# Patient Record
Sex: Female | Born: 1975 | Race: White | Hispanic: No | Marital: Single | Smoking: Current every day smoker
Health system: Southern US, Community
[De-identification: ages and names within clinical notes are randomized; demographics above are authoritative.]

## PROBLEM LIST (undated history)

## (undated) DIAGNOSIS — A4902 Methicillin resistant Staphylococcus aureus infection, unspecified site: Secondary | ICD-10-CM

## (undated) DIAGNOSIS — N83209 Unspecified ovarian cyst, unspecified side: Secondary | ICD-10-CM

## (undated) DIAGNOSIS — E079 Disorder of thyroid, unspecified: Secondary | ICD-10-CM

## (undated) HISTORY — PX: ABDOMINAL HYSTERECTOMY: SHX81

---

## 1998-07-31 ENCOUNTER — Emergency Department (HOSPITAL_COMMUNITY): Admission: EM | Admit: 1998-07-31 | Discharge: 1998-07-31 | Payer: Self-pay | Admitting: Emergency Medicine

## 2001-12-28 ENCOUNTER — Emergency Department (HOSPITAL_COMMUNITY): Admission: EM | Admit: 2001-12-28 | Discharge: 2001-12-28 | Payer: Self-pay | Admitting: *Deleted

## 2002-06-22 ENCOUNTER — Emergency Department (HOSPITAL_COMMUNITY): Admission: EM | Admit: 2002-06-22 | Discharge: 2002-06-22 | Payer: Self-pay | Admitting: *Deleted

## 2003-03-27 ENCOUNTER — Emergency Department (HOSPITAL_COMMUNITY): Admission: EM | Admit: 2003-03-27 | Discharge: 2003-03-27 | Payer: Self-pay | Admitting: Internal Medicine

## 2003-05-05 ENCOUNTER — Emergency Department (HOSPITAL_COMMUNITY): Admission: EM | Admit: 2003-05-05 | Discharge: 2003-05-05 | Payer: Self-pay | Admitting: Emergency Medicine

## 2003-05-25 ENCOUNTER — Emergency Department (HOSPITAL_COMMUNITY): Admission: EM | Admit: 2003-05-25 | Discharge: 2003-05-25 | Payer: Self-pay | Admitting: Emergency Medicine

## 2003-09-27 ENCOUNTER — Emergency Department (HOSPITAL_COMMUNITY): Admission: EM | Admit: 2003-09-27 | Discharge: 2003-09-28 | Payer: Self-pay | Admitting: Emergency Medicine

## 2003-10-22 ENCOUNTER — Inpatient Hospital Stay (HOSPITAL_COMMUNITY): Admission: EM | Admit: 2003-10-22 | Discharge: 2003-10-30 | Payer: Self-pay

## 2003-10-22 ENCOUNTER — Ambulatory Visit: Payer: Self-pay | Admitting: Infectious Diseases

## 2003-10-26 ENCOUNTER — Encounter: Payer: Self-pay | Admitting: Internal Medicine

## 2004-02-22 ENCOUNTER — Inpatient Hospital Stay (HOSPITAL_COMMUNITY): Admission: EM | Admit: 2004-02-22 | Discharge: 2004-02-25 | Payer: Self-pay | Admitting: Emergency Medicine

## 2004-02-22 ENCOUNTER — Encounter: Payer: Self-pay | Admitting: Emergency Medicine

## 2004-11-04 ENCOUNTER — Emergency Department (HOSPITAL_COMMUNITY): Admission: EM | Admit: 2004-11-04 | Discharge: 2004-11-04 | Payer: Self-pay | Admitting: Emergency Medicine

## 2004-11-29 ENCOUNTER — Emergency Department (HOSPITAL_COMMUNITY): Admission: EM | Admit: 2004-11-29 | Discharge: 2004-11-29 | Payer: Self-pay | Admitting: Emergency Medicine

## 2005-07-16 ENCOUNTER — Emergency Department (HOSPITAL_COMMUNITY): Admission: EM | Admit: 2005-07-16 | Discharge: 2005-07-16 | Payer: Self-pay | Admitting: Emergency Medicine

## 2008-02-18 ENCOUNTER — Emergency Department (HOSPITAL_BASED_OUTPATIENT_CLINIC_OR_DEPARTMENT_OTHER): Admission: EM | Admit: 2008-02-18 | Discharge: 2008-02-18 | Payer: Self-pay | Admitting: Emergency Medicine

## 2009-03-04 ENCOUNTER — Emergency Department (HOSPITAL_BASED_OUTPATIENT_CLINIC_OR_DEPARTMENT_OTHER): Admission: EM | Admit: 2009-03-04 | Discharge: 2009-03-04 | Payer: Self-pay | Admitting: Emergency Medicine

## 2009-04-12 ENCOUNTER — Emergency Department (HOSPITAL_BASED_OUTPATIENT_CLINIC_OR_DEPARTMENT_OTHER): Admission: EM | Admit: 2009-04-12 | Discharge: 2009-04-12 | Payer: Self-pay | Admitting: Emergency Medicine

## 2009-04-12 ENCOUNTER — Ambulatory Visit: Payer: Self-pay | Admitting: Diagnostic Radiology

## 2010-04-17 LAB — URINE MICROSCOPIC-ADD ON

## 2010-04-17 LAB — COMPREHENSIVE METABOLIC PANEL
ALT: 11 U/L (ref 0–35)
AST: 14 U/L (ref 0–37)
Albumin: 3.8 g/dL (ref 3.5–5.2)
Alkaline Phosphatase: 63 U/L (ref 39–117)
BUN: 10 mg/dL (ref 6–23)
CO2: 28 mEq/L (ref 19–32)
Calcium: 7.9 mg/dL — ABNORMAL LOW (ref 8.4–10.5)
Chloride: 98 mEq/L (ref 96–112)
Creatinine, Ser: 1 mg/dL (ref 0.4–1.2)
GFR calc Af Amer: >60 mL/min (ref >60)
GFR calc non Af Amer: >60 mL/min (ref >60)
Glucose, Bld: 111 mg/dL — ABNORMAL HIGH (ref 70–99)
Potassium: 2.6 mEq/L — CL (ref 3.5–5.1)
Sodium: 136 mEq/L (ref 135–145)
Total Bilirubin: 0.7 mg/dL (ref 0.3–1.2)
Total Protein: 6.9 g/dL (ref 6.0–8.3)

## 2010-04-17 LAB — URINE CULTURE: Colony Count: >=100000

## 2010-04-17 LAB — DIFFERENTIAL
Basophils Absolute: 0.1 10*3/uL (ref 0.0–0.1)
Basophils Relative: 1 % (ref 0–1)
Eosinophils Absolute: 0 10*3/uL (ref 0.0–0.7)
Eosinophils Relative: 0 % (ref 0–5)
Lymphocytes Relative: 9 % — ABNORMAL LOW (ref 12–46)
Lymphs Abs: 1.1 10*3/uL (ref 0.7–4.0)
Monocytes Absolute: 1.1 10*3/uL — ABNORMAL HIGH (ref 0.1–1.0)
Monocytes Relative: 8 % (ref 3–12)
Neutro Abs: 10.6 10*3/uL — ABNORMAL HIGH (ref 1.7–7.7)
Neutrophils Relative %: 82 % — ABNORMAL HIGH (ref 43–77)

## 2010-04-17 LAB — CBC
HCT: 38.3 % (ref 36.0–46.0)
Hemoglobin: 13.1 g/dL (ref 12.0–15.0)
MCHC: 34.3 g/dL (ref 30.0–36.0)
MCV: 90 fL (ref 78.0–100.0)
Platelets: 139 10*3/uL — ABNORMAL LOW (ref 150–400)
RBC: 4.25 MIL/uL (ref 3.87–5.11)
RDW: 12.1 % (ref 11.5–15.5)
WBC: 12.9 10*3/uL — ABNORMAL HIGH (ref 4.0–10.5)

## 2010-04-17 LAB — URINALYSIS, ROUTINE W REFLEX MICROSCOPIC
Bilirubin Urine: NEGATIVE
Glucose, UA: NEGATIVE mg/dL
Ketones, ur: NEGATIVE mg/dL
Nitrite: NEGATIVE
Protein, ur: 100 mg/dL — AB
Specific Gravity, Urine: 1.016 (ref 1.005–1.030)
Urobilinogen, UA: 1 mg/dL (ref 0.0–1.0)
pH: 6.5 (ref 5.0–8.0)

## 2010-04-17 LAB — PREGNANCY, URINE: Preg Test, Ur: NEGATIVE

## 2010-04-22 LAB — URINALYSIS, ROUTINE W REFLEX MICROSCOPIC
Bilirubin Urine: NEGATIVE
Glucose, UA: NEGATIVE mg/dL
Hgb urine dipstick: NEGATIVE
Ketones, ur: NEGATIVE mg/dL
Nitrite: NEGATIVE
Protein, ur: NEGATIVE mg/dL
Specific Gravity, Urine: 1.026 (ref 1.005–1.030)
Urobilinogen, UA: 1 mg/dL (ref 0.0–1.0)
pH: 6 (ref 5.0–8.0)

## 2010-06-12 IMAGING — US US PELVIS COMPLETE MODIFY
1 series · 14 of 25 positions shown · non-contrast
Comparison: None.

CLINICAL DATA: Left lower quadrant and left pelvic pain.  Family
history of ovarian cancer.

TRANSABDOMINAL AND TRANSVAGINAL ULTRASOUND OF PELVIS
DOPPLER ULTRASOUND OF OVARIES
TECHNIQUE: Both transabdominal and transvaginal ultrasound
examinations of the pelvis were performed including evaluation of
the uterus, ovaries, adnexal regions, and pelvic cul-de-sac.  Color
and duplex Doppler ultrasound was utilized to evaluate blood flow
to the ovaries.

[Series 1: us pelvis complete modify · 0.28mm/px · 14 of 42 slices shown]
[im 1/42]
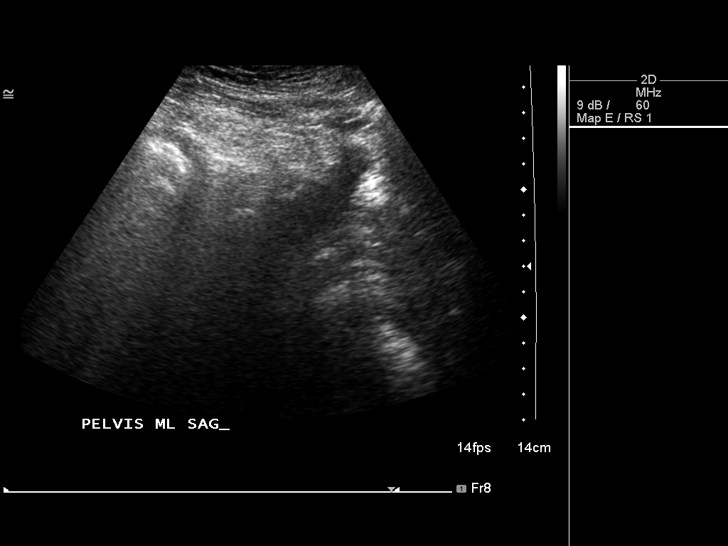
[im 4/42]
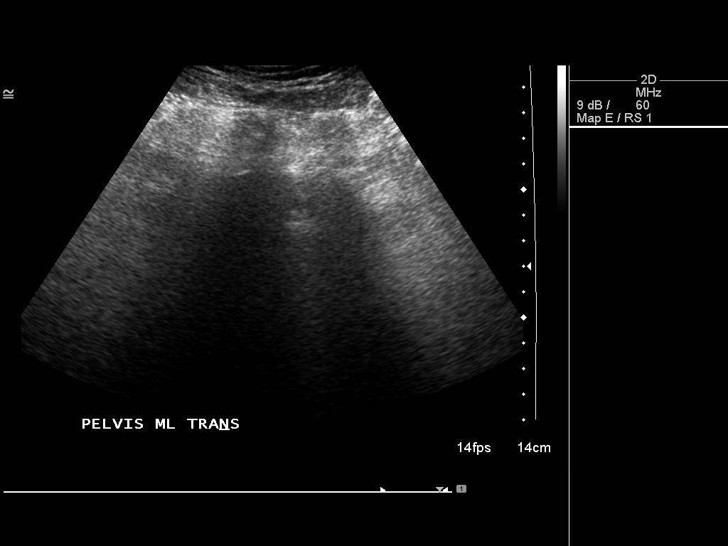
[im 7/42]
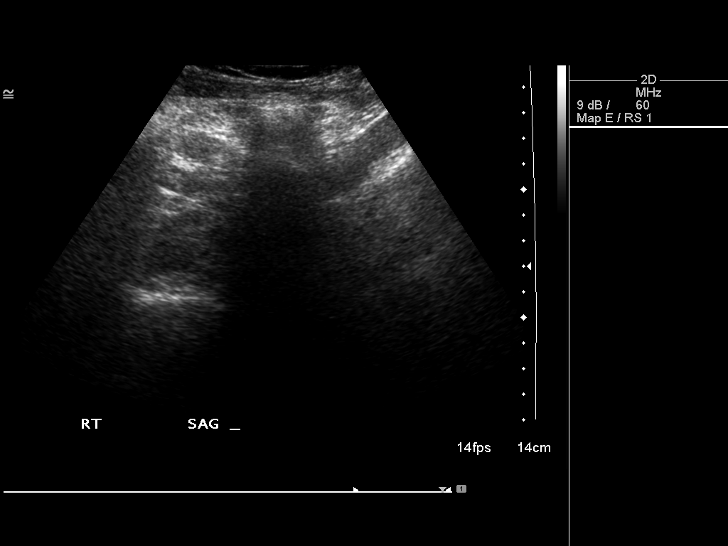
[im 11/42]
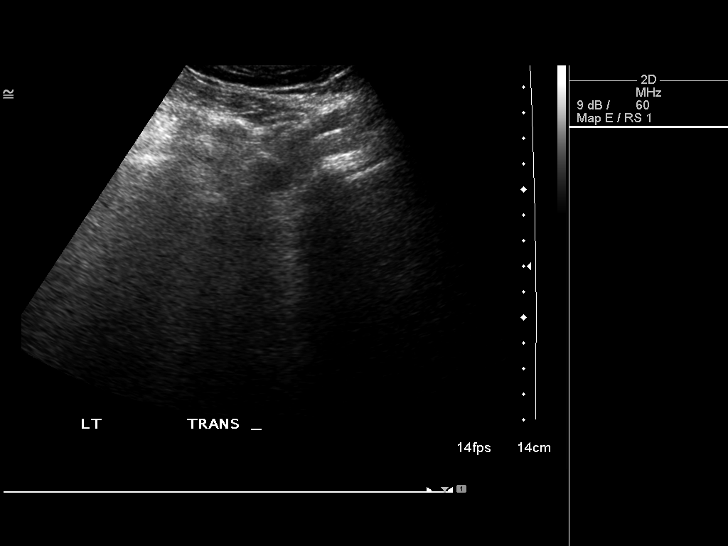
[im 14/42]
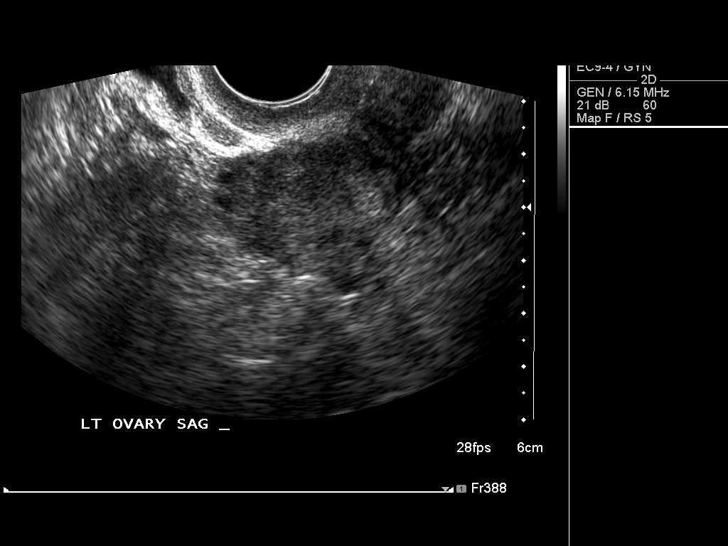
[im 16/42]
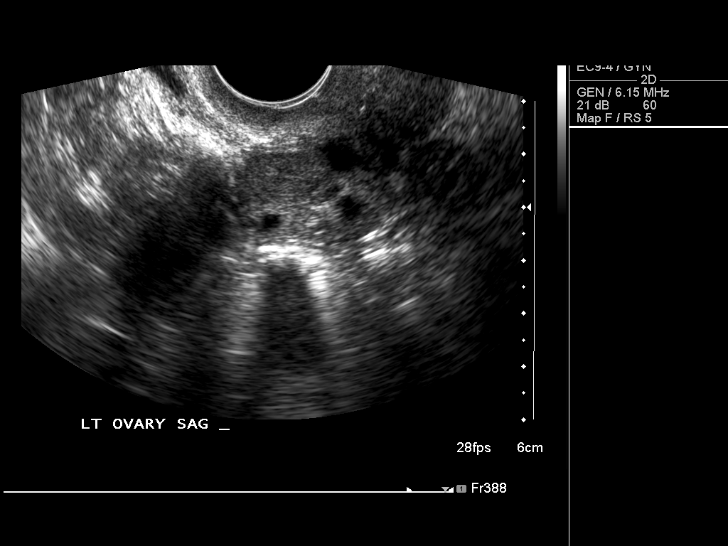
[im 19/42]
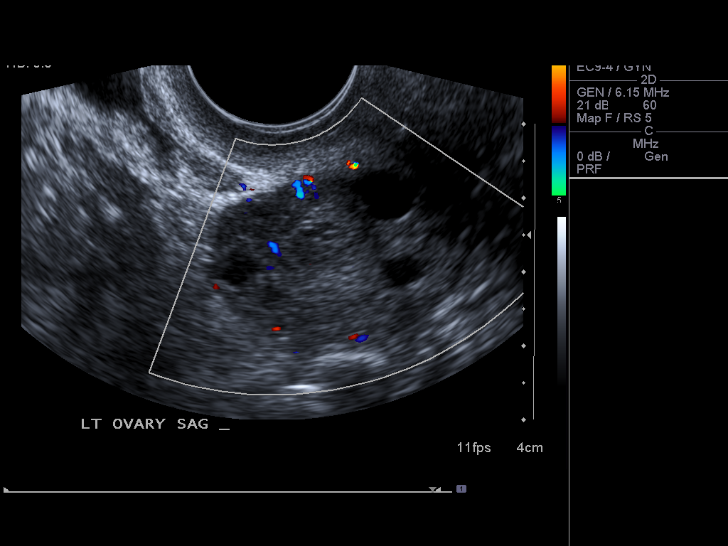
[im 23/42]
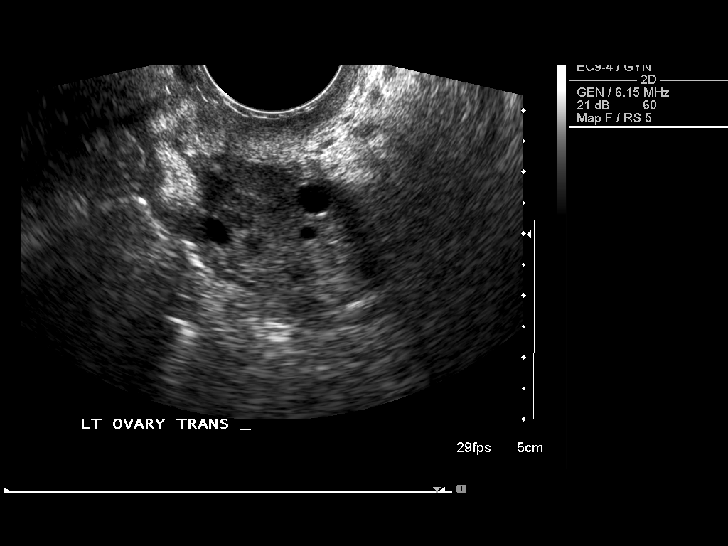
[im 26/42]
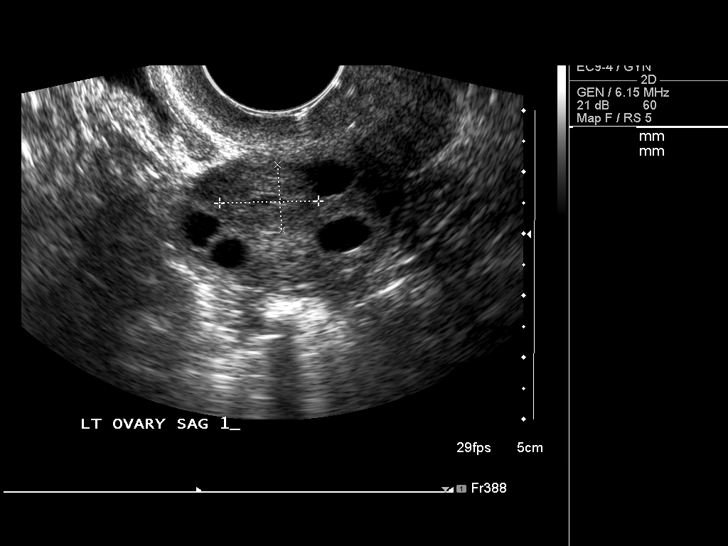
[im 28/42]
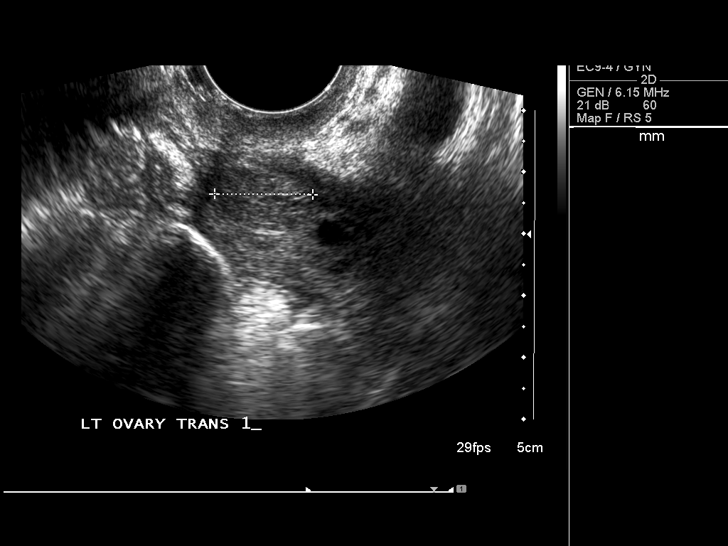
[im 31/42]
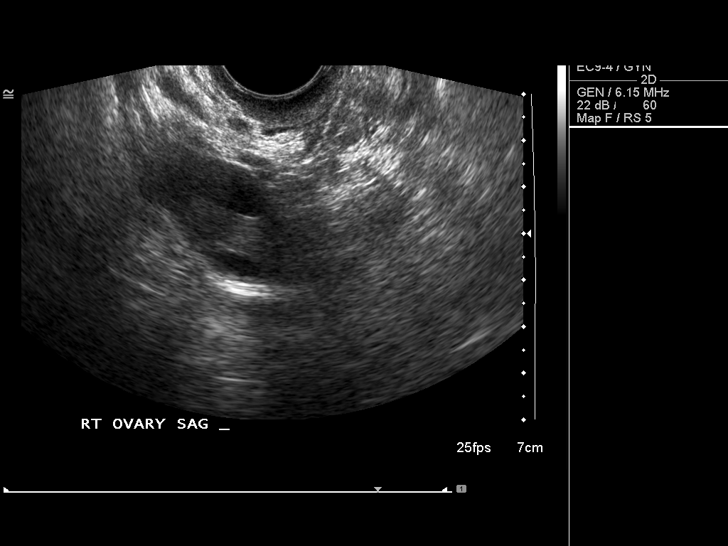
[im 35/42]
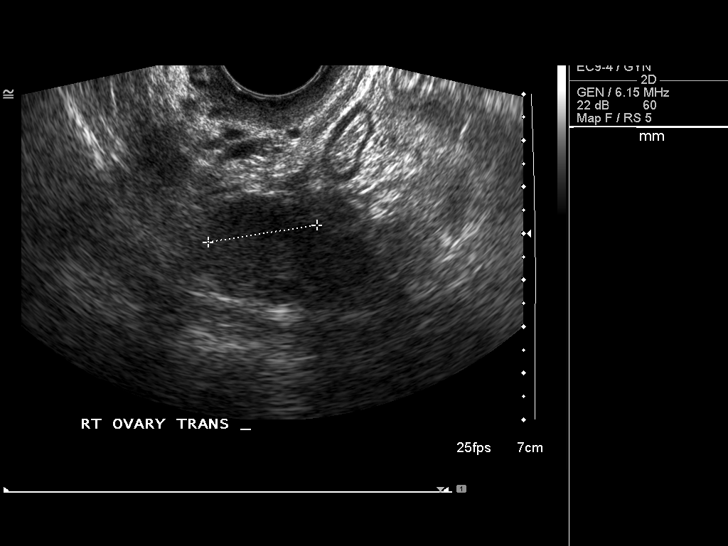
[im 38/42]
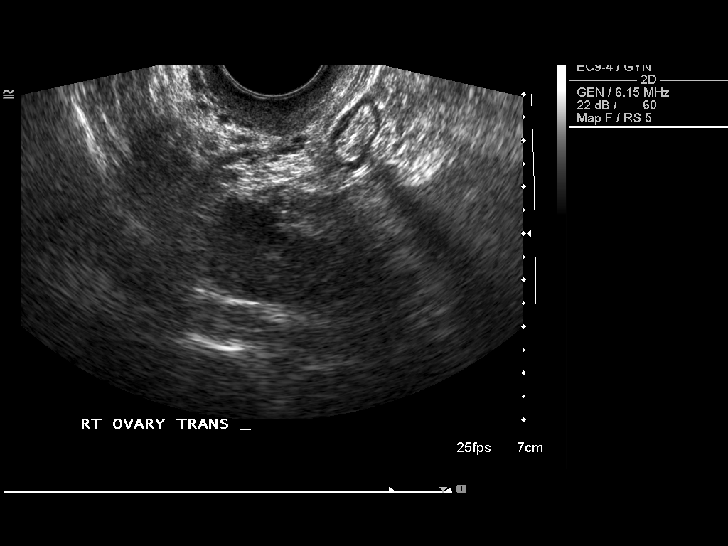
[im 42/42]
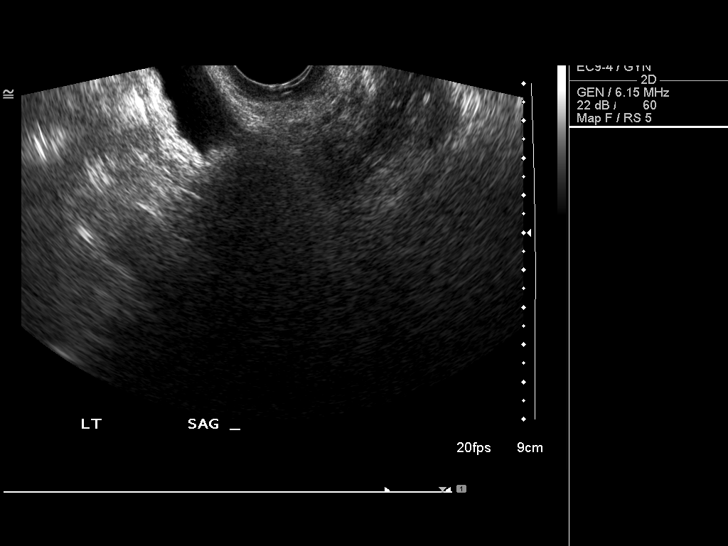

[14 of 25 positions shown; findings below may reference images not displayed]

FINDINGS: Uterus is surgically absent.  Vaginal cuff is unremarkable in
appearance.

Right Ovary measures 3.5 x 2.2 x 2.4 cm.  Normal appearance.

Left Ovary measures 3.9 x 2.3 x 3.2 cm. Normal appearance.

Other Findings:  No adnexal mass or free fluid identified.

Doppler Evaluation:  Color Doppler shows blood flow is present
within both ovaries.  Spectral Doppler evaluation shows normal
arterial and venous waveform patterns in the ovaries.
IMPRESSION: 1.  Previous hysterectomy.  No evidence of pelvic mass or other
acute findings.
2.  No evidence of ovarian torsion.

## 2010-06-14 NOTE — Op Note (Signed)
[name], KASIK                ACCOUNT NO.:  [id number]   MEDICAL RECORD NO.:  [id number]          PATIENT TYPE:  INP   LOCATION:  5038                         FACILITY:  [facility]   PHYSICIAN:  Katy Fitch. Sypher Montez Hageman., M.D.DATE OF BIRTH:  1975/12/08   DATE OF PROCEDURE:  02/22/2004  DATE OF DISCHARGE:                                 OPERATIVE REPORT   DIAGNOSIS:  Complex abscesses involving left thumb and index finger pulps  with history of probable prior viral herpetic whitlow superinfected by  attempts at drainage at home utilizing a needle with probable secondary  Staph infection who MRSA infection.   POSTOPERATIVE DIAGNOSES:  1.  Complex abscesses involving left thumb and index finger pulps with      history of probable prior viral herpetic whitlow superinfected by      attempts at drainage at home utilizing a needle with probable secondary      Staph infection who MRSA infection.  2.  Identification of probable Staphylococcus abscess left index finger pulp      and probable viral abscess of left thumb pulp.   OPERATION:  1.  Incision and drainage of left index finger abscess with placement of an      Iodoform drain and debridement of necrotic dermis and subcutaneous      tissue.  2.  Incision and drainage of left thumb pulp with placement Iodoform packing      and aerobic and aerobic and viral cultures.   OPERATING SURGEON:  Katy Fitch. Sypher, M.D.   ASSISTANT:  No assistant.   ANESTHESIA:  General by LMA.   SUPERVISING ANESTHESIOLOGIST:  Dr. Krista Blue.   INDICATIONS:  Lanie Schelling is a 35 year old woman who admittedly smokes  crack and regular basis.   She has a history of developing swelling of her left index finger pulp and  left thumb pulp during the past several days. She developed tense swelling  and thought she had infection.  Therefore home, she attempted to drain her  fingertips with a sterilized needle. She developed marked discoloration of  her index pulp and  thumb pulp and presented to the emergency room at [facility] this morning with severely swollen discolored  fingers.   She was seen by Dr. Lora Havens attending emergency room physician who made  the diagnosis of probable necrotizing infection of her fingers and requested  hand surgery trauma consult.   Olivine was transferred to [facility] due to a past  history of MRSA endocarditis and is admitted at this time for urgent  incision and drainage of her fingers.   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   She denied smoking crack during  the past several days, although blood  screens at this time were positive for cocaine. She was advised that we  would perform immediate incision and drainage of her fingers anticipating  admission for IV antibiotic therapy initially with vancomycin to cover MRSA.   PROCEDURE:  Caffie Sotto is brought to  operating room and placed supine  position on the operating table.   Following anesthesia consultation by Dr. Krista Blue, general anesthesia by LMA  was induced.   Left arm was prepped with Betadine soap and solution and sterilely draped.   A pneumatic tourniquet was applied to the proximal brachium.   The left arm was elevated for one minute and exsanguinated by a hand  compression followed by inflation arterial tourniquet to 220 mmHg.   Procedure commenced with a midline incision in the index finger releasing  purulent material under pressure. A blunt hemostat was used to spread the  pulp fat and a considerable amount of necrotic fat and fascia was extruded  from the radial aspect of the pulp.   Care was taken to protect the branches of the radial proper digital artery  and nerve.   The necrotic dermis, epidermis and subcutaneous tissues were sharply  debrided followed by placement of a through-and-through iodoform  packing  into the index pulp.   Attention was then directed to the thumb. A radial mid lateral incision was  fashioned in the pulp followed by spreading covering serous fluid that had a  brownish tinge. The thumb drainage was more consistent with a chronic  hepatic whitlow without superinfection.   Aerobic and anaerobic cultures were obtained from the index finger.  Anaerobic and aerobic cultures were from obtained from the thumb as well as  a herpes simplex viral culture.   The thumb wound was then packed with through-and-through Iodoform.   The hand was then placed in a voluminous gauze dressing was with sterile  Kerlix, Webril and a three inch Ace bandage.   There were no apparent complications.   Ms. Pond was awakened from general anesthesia and transferred to the cover  room with stable vital signs.   She will be admitted for IV antibiotic therapy initially in the form of  vancomycin 1 gram IV in the operating room and post anesthesia care area  followed by admission for the vancomycin protocol for a  minimum 48 hours  pending culture results.      RVS/MEDQ  D:  02/22/2004  T:  02/22/2004  Job:  [id number]
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
## 2010-06-14 NOTE — Discharge Summary (Signed)
[name], [name]                ACCOUNT NO.:  [id number]   MEDICAL RECORD NO.:  [id number]          PATIENT TYPE:  INP   LOCATION:  5038                         FACILITY:  [facility]   PHYSICIAN:  Marveen Reeks. Dasnoit, P.A.DATE OF BIRTH:  1975-03-06   DATE OF ADMISSION:  02/22/2004  DATE OF DISCHARGE:  02/25/2004                                 DISCHARGE SUMMARY   ADMISSION DIAGNOSIS:  Abscess left thumb and left index fingers.   DISCHARGE DIAGNOSIS:  Abscess left thumb and left index finger.   OPERATION PERFORMED:  Incision and drainage of left hand abscesses with  interoperative cultures by Dr. Katy Fitch. Sypher, February 22, 2004.   HISTORY:  The patient is a 35 year old right hand dominant female who  presented to the emergency room  complaining of a one week history of  infected left thumb and index finger.  These were self-drained but recurred.  There was no medical treatment until today when she presented to the  hospital.  She was transferred from [facility] to [facility] emergency room  for hand consultation.  She was seen by Dr. Katy Fitch. Sypher and it was felt  that she had complex right hand abscesses that needed incision and drainage  and admission postoperatively for IV antibiotics.  The patient did have a  recent history of MRSA endocarditis and was admitted to [facility]  in October 2005 for treatment.  This apparently occurred from a left elbow  wound.  The patient also did have a history of crack cocaine abuse.  Preoperative EKG revealed sinus rhythm with short PR, nonspecific T-wave  abnormality.  White blood cell count was 7.9.   HOSPITAL COURSE:  The patient was taken to the operating room on May 22, 2004, where she underwent I&D of left hand abscesses of the first and second  rays.  Interoperative cultures were taken in addition to Herpes viral  cultures.  Postoperatively, she was started on vancomycin 1 gram IV per the  pharmacy protocol.  She was  given Dilaudid p.o./IV for pain control.  She  was placed on wound isolation.  On January 27, her first day postop, she had  a max temperature of 98.6, vital signs were stable, culture and sensitivity  revealed gram positive cocci.  Her packing was removed in whirlpool.  During  the exam, the patient kept nodding off.  Overall, her wounds were  stabilizing.  We continued her vancomycin per protocol.  Her final cultures  were pending.  On exam January 28, the patient was seen in the whirlpool,  the wounds were doing well.  On January 29, the patient remained afebrile  with stable vital signs, there were minimal complaints of pain.  Dressings  were intact.  It was felt  that the patient was ready for discharge to home.  She was given prescriptions for Doxycycline 100 mg p.o. b.i.d., Septra DS 1  p.o. b.i.d., Dilaudid for pain control.  She will return to see [place] in one  day on February 26, 2004, for recheck.  Her postop diet is as usual.  Activities:  She will keep her hand elevated and dressing dry.  Her  condition on discharge is stable and improved.   FINAL DIAGNOSIS:  Complex left hand abscesses of the left first and second  rays that eventually grew out MRSA on her cultures.     RJD/MEDQ  D:  05/23/2004  T:  05/23/2004  Job:  [id number]

## 2010-06-26 ENCOUNTER — Emergency Department (HOSPITAL_COMMUNITY)
Admission: EM | Admit: 2010-06-26 | Discharge: 2010-06-26 | Disposition: A | Discharge status: Home / Self Care | Payer: Self-pay | Attending: Emergency Medicine | Admitting: Emergency Medicine

## 2010-06-26 ENCOUNTER — Emergency Department (HOSPITAL_COMMUNITY): Payer: Self-pay

## 2010-06-26 DIAGNOSIS — IMO0002 Reserved for concepts with insufficient information to code with codable children: Secondary | ICD-10-CM | POA: Insufficient documentation

## 2010-06-26 DIAGNOSIS — T192XXA Foreign body in vulva and vagina, initial encounter: Secondary | ICD-10-CM | POA: Insufficient documentation

## 2010-06-26 DIAGNOSIS — Y998 Other external cause status: Secondary | ICD-10-CM | POA: Insufficient documentation

## 2010-09-26 ENCOUNTER — Emergency Department (HOSPITAL_BASED_OUTPATIENT_CLINIC_OR_DEPARTMENT_OTHER)
Admission: EM | Admit: 2010-09-26 | Discharge: 2010-09-26 | Disposition: A | Discharge status: Home / Self Care | Payer: Self-pay | Attending: Emergency Medicine | Admitting: Emergency Medicine

## 2010-09-26 ENCOUNTER — Encounter: Payer: Self-pay | Admitting: *Deleted

## 2010-09-26 DIAGNOSIS — J40 Bronchitis, not specified as acute or chronic: Secondary | ICD-10-CM | POA: Insufficient documentation

## 2010-09-26 DIAGNOSIS — E079 Disorder of thyroid, unspecified: Secondary | ICD-10-CM | POA: Insufficient documentation

## 2010-09-26 HISTORY — DX: Disorder of thyroid, unspecified: E07.9

## 2010-09-26 MED ORDER — HYDROCOD POLST-CHLORPHEN POLST 10-8 MG/5ML PO LQCR: ORAL | Status: DC

## 2010-09-26 MED ORDER — DEXAMETHASONE 4 MG PO TABS
10.0000 mg | ORAL_TABLET | Freq: Once | ORAL | Status: AC
  Administered 2010-09-26: 10 mg via ORAL
  Filled 2010-09-26: qty 3

## 2010-09-26 MED ORDER — ALBUTEROL SULFATE (5 MG/ML) 0.5% IN NEBU
INHALATION_SOLUTION | RESPIRATORY_TRACT | Status: AC
  Filled 2010-09-26: qty 1

## 2010-09-26 MED ORDER — AZITHROMYCIN 250 MG PO TABS: ORAL_TABLET | ORAL | Status: DC

## 2010-09-26 MED ORDER — ALBUTEROL SULFATE (5 MG/ML) 0.5% IN NEBU
5.0000 mg | INHALATION_SOLUTION | Freq: Once | RESPIRATORY_TRACT | Status: AC
  Administered 2010-09-26: 5 mg via RESPIRATORY_TRACT
  Filled 2010-09-26: qty 1

## 2010-09-26 MED ORDER — IPRATROPIUM BROMIDE 0.02 % IN SOLN
0.5000 mg | Freq: Once | RESPIRATORY_TRACT | Status: AC
  Administered 2010-09-26: 0.5 mg via RESPIRATORY_TRACT

## 2010-09-26 MED ORDER — ALBUTEROL SULFATE HFA 108 (90 BASE) MCG/ACT IN AERS
2.0000 | INHALATION_SPRAY | RESPIRATORY_TRACT | Status: DC | PRN
  Administered 2010-09-26: 2 via RESPIRATORY_TRACT
  Filled 2010-09-26: qty 6.7

## 2010-09-26 MED ORDER — ALBUTEROL SULFATE (5 MG/ML) 0.5% IN NEBU
5.0000 mg | INHALATION_SOLUTION | Freq: Once | RESPIRATORY_TRACT | Status: AC
  Administered 2010-09-26: 5 mg via RESPIRATORY_TRACT

## 2010-09-26 MED ORDER — IPRATROPIUM BROMIDE 0.02 % IN SOLN
RESPIRATORY_TRACT | Status: AC
  Filled 2010-09-26: qty 2.5

## 2010-09-26 NOTE — ED Notes (Signed)
Cold symptoms that began 4 days ago, and pt now c/o severe chest congestion. Worsens while laying down at night. Taking mucinex without relief.

## 2010-09-26 NOTE — ED Notes (Signed)
DR Molpus at bedside

## 2010-09-26 NOTE — ED Provider Notes (Signed)
History     CSN: [id number] Arrival date & time: 09/26/2010  4:20 AM  Chief Complaint  Patient presents with  . URI   HPI This is a 35 year old female with a three-day history of cold symptoms. He began with subjective fever nasal congestion sore throat and cough. This progressed to a severe cough with dyspnea and wheezing. Dyspnea is moderate to severe and worse when supine. She is taking Mucinex without relief. She denies nausea vomiting or diarrhea. Her chest is sore from coughing.  Past Medical History  Diagnosis Date  . Thyroid disease     Past Surgical History  Procedure Date  . Abdominal hysterectomy     No family history on file.  History  Substance Use Topics  . Smoking status: Current Everyday Smoker  . Smokeless tobacco: Not on file  . Alcohol Use: No    OB History    Grav Para Term Preterm Abortions TAB SAB Ect Mult Living                  Review of Systems  All other systems reviewed and are negative.    Physical Exam  BP 133/83  Pulse 66  Temp(Src) 98.2 F (36.8 C) (Oral)  Resp 18  Ht 5\' 6"  (1.676 m)  Wt 150 lb (68.04 kg)  BMI 24.21 kg/m2  SpO2 97%  Physical Exam General: Well-developed, well-nourished female in no acute distress; appearance consistent with age of record HENT: normocephalic, atraumatic; normal appearing oropharynx Eyes: pupils equal round and reactive to light; extraocular muscles intact Neck: supple Heart: regular rate and rhythm Lungs: Shallow respirations and decreased air movement without frank wheezing; coughing particularly when attempting to take deep breath Chest: Tender to palpation bilaterally Abdomen: soft; nontender Extremities: No deformity; full range of motion; pulses normal Neurologic: Awake, alert and oriented;motor function intact in all extremities and symmetric; no facial droop Skin: Warm and dry    ED Course  Procedures  MDM 5:22 AM Improved air movement after second neb treatment. Will treat  with Z-Pak for bronchitis and sent home with albuterol inhaler.      Hanley Seamen, MD 09/26/10 [phone]

## 2010-10-05 ENCOUNTER — Emergency Department (HOSPITAL_BASED_OUTPATIENT_CLINIC_OR_DEPARTMENT_OTHER)
Admission: EM | Admit: 2010-10-05 | Discharge: 2010-10-05 | Disposition: A | Discharge status: Home / Self Care | Payer: Self-pay | Attending: Emergency Medicine | Admitting: Emergency Medicine

## 2010-10-05 ENCOUNTER — Inpatient Hospital Stay (HOSPITAL_COMMUNITY): Payer: Self-pay

## 2010-10-05 ENCOUNTER — Inpatient Hospital Stay (HOSPITAL_COMMUNITY)
Admission: AD | Admit: 2010-10-05 | Discharge: 2010-10-05 | Disposition: A | Discharge status: Home / Self Care | Payer: Self-pay | Source: Ambulatory Visit | Attending: Family Medicine | Admitting: Family Medicine

## 2010-10-05 ENCOUNTER — Encounter (HOSPITAL_COMMUNITY): Payer: Self-pay | Admitting: *Deleted

## 2010-10-05 ENCOUNTER — Encounter (HOSPITAL_BASED_OUTPATIENT_CLINIC_OR_DEPARTMENT_OTHER): Payer: Self-pay | Admitting: Emergency Medicine

## 2010-10-05 DIAGNOSIS — N83209 Unspecified ovarian cyst, unspecified side: Secondary | ICD-10-CM

## 2010-10-05 DIAGNOSIS — N83202 Unspecified ovarian cyst, left side: Secondary | ICD-10-CM

## 2010-10-05 DIAGNOSIS — R1032 Left lower quadrant pain: Secondary | ICD-10-CM | POA: Insufficient documentation

## 2010-10-05 DIAGNOSIS — E079 Disorder of thyroid, unspecified: Secondary | ICD-10-CM | POA: Insufficient documentation

## 2010-10-05 HISTORY — DX: Unspecified ovarian cyst, unspecified side: N83.209

## 2010-10-05 LAB — URINALYSIS, ROUTINE W REFLEX MICROSCOPIC
Bilirubin Urine: NEGATIVE
Glucose, UA: NEGATIVE mg/dL
Hgb urine dipstick: NEGATIVE
Ketones, ur: NEGATIVE mg/dL
Nitrite: NEGATIVE
pH: 6 (ref 5.0–8.0)

## 2010-10-05 LAB — CBC
HCT: 37.3 % (ref 36.0–46.0)
Hemoglobin: 13 g/dL (ref 12.0–15.0)
MCH: 30.9 pg (ref 26.0–34.0)
MCHC: 34.9 g/dL (ref 30.0–36.0)
MCV: 88.6 fL (ref 78.0–100.0)

## 2010-10-05 LAB — BASIC METABOLIC PANEL
BUN: 11 mg/dL (ref 6–23)
CO2: 25 mEq/L (ref 19–32)
Calcium: 9.1 mg/dL (ref 8.4–10.5)
Creatinine, Ser: 0.9 mg/dL (ref 0.50–1.10)
GFR calc non Af Amer: >60 mL/min (ref >60)
Glucose, Bld: 80 mg/dL (ref 70–99)

## 2010-10-05 MED ORDER — KETOROLAC TROMETHAMINE 60 MG/2ML IM SOLN
60.0000 mg | Freq: Once | INTRAMUSCULAR | Status: AC
  Administered 2010-10-05: 60 mg via INTRAMUSCULAR
  Filled 2010-10-05: qty 2

## 2010-10-05 MED ORDER — NAPROXEN SODIUM 550 MG PO TABS: 550.0000 mg | ORAL_TABLET | Freq: Two times a day (BID) | ORAL | Status: DC

## 2010-10-05 MED ORDER — HYDROCODONE-ACETAMINOPHEN 5-325 MG PO TABS: 2.0000 | ORAL_TABLET | ORAL | Status: AC | PRN

## 2010-10-05 MED ORDER — FENTANYL CITRATE 0.05 MG/ML IJ SOLN
50.0000 ug | Freq: Once | INTRAMUSCULAR | Status: AC
  Administered 2010-10-05: 50 ug via INTRAMUSCULAR
  Filled 2010-10-05: qty 2

## 2010-10-05 NOTE — ED Notes (Signed)
Pt c/o LLQ abd pain x 6 hours. Pt states pain is consistent with previous ovarian cyst.

## 2010-10-05 NOTE — ED Notes (Signed)
Pt was seen [facility] earlier tonight with LLQ pain. Pt was worked up but could not have u/s there due to machine being down. Was sent to [facility] for u/s. Magda Kiel CNM was notified by [facility]. Pt to have Pelvic u/s per CNM

## 2010-10-05 NOTE — ED Provider Notes (Signed)
History     CSN: [id number] Arrival date & time: 10/05/2010  5:13 AM  Chief Complaint  Patient presents with  . Abdominal Pain   Patient is a 35 y.o. female presenting with abdominal pain. The history is provided by the patient. No language interpreter was used.  Abdominal Pain The primary symptoms of the illness include abdominal pain. The primary symptoms of the illness do not include fever, fatigue, shortness of breath, nausea, vomiting, diarrhea, hematemesis, hematochezia, dysuria, vaginal discharge or vaginal bleeding. The current episode started 6 to 12 hours ago. The onset of the illness was sudden. The problem has not changed since onset. Associated with: h/o overian cysts  The patient states that she believes she is currently not pregnant. The patient has not had a change in bowel habit. Symptoms associated with the illness do not include chills, anorexia, diaphoresis, heartburn, constipation, urgency, hematuria, frequency or back pain. Significant associated medical issues do not include PUD, GERD, inflammatory bowel disease, diabetes, sickle cell disease, gallstones, liver disease, diverticulitis, HIV or cardiac disease.    Past Medical History  Diagnosis Date  . Thyroid disease   . Ovarian cyst     Past Surgical History  Procedure Date  . Abdominal hysterectomy     No family history on file.  History  Substance Use Topics  . Smoking status: Current Everyday Smoker  . Smokeless tobacco: Not on file  . Alcohol Use: No    OB History    Grav Para Term Preterm Abortions TAB SAB Ect Mult Living                  Review of Systems  Constitutional: Negative for fever, chills, diaphoresis and fatigue.  Respiratory: Negative for shortness of breath.   Cardiovascular: Negative.   Gastrointestinal: Positive for abdominal pain. Negative for heartburn, nausea, vomiting, diarrhea, constipation, hematochezia, anorexia and hematemesis.  Genitourinary: Negative for dysuria,  urgency, frequency, hematuria, vaginal bleeding and vaginal discharge.  Musculoskeletal: Negative for back pain.  Neurological: Negative.   Hematological: Negative.   Psychiatric/Behavioral: Negative.     Physical Exam  BP 124/81  Pulse 74  Temp(Src) 98.1 F (36.7 C) (Oral)  Resp 18  SpO2 99%  Physical Exam  Constitutional: She is oriented to person, place, and time. She appears well-developed and well-nourished. No distress.  HENT:  Head: Normocephalic and atraumatic.  Eyes: EOM are normal. Pupils are equal, round, and reactive to light.  Neck: Normal range of motion. Neck supple.  Cardiovascular: Normal rate and regular rhythm.   Pulmonary/Chest: Effort normal and breath sounds normal. No respiratory distress.  Abdominal: Soft. Bowel sounds are normal. She exhibits no distension and no mass. There is tenderness. There is no rebound and no guarding.  Musculoskeletal: Normal range of motion.  Neurological: She is alert and oriented to person, place, and time.  Skin: Skin is warm and dry.  Psychiatric: She has a normal mood and affect.    ED Course  Procedures  MDM Case d/w Maylon Cos at [facility], send by POV if stable for stat US to r/o ovarian torsion      Santhiago Collingsworth K Norton Bivins-Rasch, MD 10/05/10 [phone]

## 2010-10-05 NOTE — Progress Notes (Signed)
Pt has hx MRSA

## 2010-10-05 NOTE — ED Provider Notes (Signed)
[name] is a 35 y.o. female who presents to [facility] for ultrasound. She had a full evaluation at [facility] with the exception of ultrasound due to not having availability. Therefor the patient was sent to [facility]U for ultrasound for possible torsion. The ultrasound today shows a CLC on the left and otherwise normal. The patient was given an injection of toradol on arrival to [facility]U. She had received fentnel at [facility].  The patient states that the toradol did help. Will discharge patient home with Rx for Anaprox DS and Vicodin. She will follow up with her GYN in [facility].  [place], [place] 10/05/10 [phone]

## 2010-10-05 NOTE — Progress Notes (Signed)
Pain in LLQ since Friday. G2P2 SVDs. Had hysterectomy 6mos after delivered 2nd baby. Has both ovaries. Hx ovarian cysts and current pain feels like when had cysts in past

## 2010-10-05 NOTE — Progress Notes (Signed)
Pt unaware had hx MRSA when questioned.

## 2011-01-05 ENCOUNTER — Encounter (HOSPITAL_BASED_OUTPATIENT_CLINIC_OR_DEPARTMENT_OTHER): Payer: Self-pay | Admitting: *Deleted

## 2011-01-05 ENCOUNTER — Emergency Department (HOSPITAL_BASED_OUTPATIENT_CLINIC_OR_DEPARTMENT_OTHER)
Admission: EM | Admit: 2011-01-05 | Discharge: 2011-01-05 | Disposition: A | Discharge status: Home / Self Care | Payer: Self-pay | Attending: Emergency Medicine | Admitting: Emergency Medicine

## 2011-01-05 DIAGNOSIS — M62838 Other muscle spasm: Secondary | ICD-10-CM | POA: Insufficient documentation

## 2011-01-05 DIAGNOSIS — M542 Cervicalgia: Secondary | ICD-10-CM | POA: Insufficient documentation

## 2011-01-05 DIAGNOSIS — F17200 Nicotine dependence, unspecified, uncomplicated: Secondary | ICD-10-CM | POA: Insufficient documentation

## 2011-01-05 MED ORDER — IBUPROFEN 800 MG PO TABS
800.0000 mg | ORAL_TABLET | Freq: Once | ORAL | Status: AC
  Administered 2011-01-05: 800 mg via ORAL
  Filled 2011-01-05: qty 1

## 2011-01-05 MED ORDER — HYDROCODONE-ACETAMINOPHEN 5-325 MG PO TABS
2.0000 | ORAL_TABLET | Freq: Once | ORAL | Status: AC
  Administered 2011-01-05: 2 via ORAL
  Filled 2011-01-05: qty 2

## 2011-01-05 MED ORDER — DIAZEPAM 5 MG PO TABS: 5.0000 mg | ORAL_TABLET | Freq: Four times a day (QID) | ORAL | Status: DC | PRN

## 2011-01-05 MED ORDER — IBUPROFEN 600 MG PO TABS: 600.0000 mg | ORAL_TABLET | Freq: Three times a day (TID) | ORAL | Status: AC | PRN

## 2011-01-05 MED ORDER — HYDROCODONE-ACETAMINOPHEN 5-325 MG PO TABS: 2.0000 | ORAL_TABLET | ORAL | Status: AC | PRN

## 2011-01-05 MED ORDER — DIAZEPAM 5 MG PO TABS
5.0000 mg | ORAL_TABLET | Freq: Once | ORAL | Status: AC
  Administered 2011-01-05: 5 mg via ORAL
  Filled 2011-01-05: qty 1

## 2011-01-05 NOTE — ED Provider Notes (Signed)
History    Scribed for Felisa Bonier, MD, the patient was seen in room [facility]. This chart was scribed by Katha Cabal.   CSN: [id number] Arrival date & time: 01/05/2011  6:21 PM   First MD Initiated Contact with Patient 01/05/11 1905      Chief Complaint  Patient presents with  . Neck Pain    (Consider location/radiation/quality/duration/timing/severity/associated sxs/prior treatment) Patient is a 35 y.o. female presenting with neck pain. The history is provided by the patient. No language interpreter was used.  Neck Pain  This is a new problem. Episode onset: about a week  The problem occurs constantly. The problem has been gradually worsening. The pain is present in the generalized neck. The symptoms are aggravated by twisting. Associated symptoms include headaches. Pertinent negatives include no numbness, no bladder incontinence, no leg pain and no weakness.  Sudden onset of neck pain.    Past Medical History  Diagnosis Date  . Thyroid disease   . Ovarian cyst     Past Surgical History  Procedure Date  . Abdominal hysterectomy     History reviewed. No pertinent family history.  History  Substance Use Topics  . Smoking status: Current Everyday Smoker  . Smokeless tobacco: Not on file  . Alcohol Use: No    OB History    Grav Para Term Preterm Abortions TAB SAB Ect Mult Living   2 2        2       Review of Systems  HENT: Positive for neck pain.   Genitourinary: Negative for bladder incontinence.  Neurological: Positive for headaches. Negative for weakness and numbness.  All other systems reviewed and are negative.    Allergies  Onion  Home Medications   Current Outpatient Rx  Name Route Sig Dispense Refill  . ALBUTEROL SULFATE HFA 108 (90 BASE) MCG/ACT IN AERS Inhalation Inhale 2 puffs into the lungs every 6 (six) hours as needed. For shortness of breath and wheezing     . IBUPROFEN 200 MG PO TABS Oral Take 800 mg by mouth every 6 (six) hours as  needed. For pain     . NAPROXEN SODIUM 220 MG PO TABS Oral Take 440 mg by mouth 2 (two) times daily with a meal.      . DIAZEPAM 5 MG PO TABS Oral Take 1 tablet (5 mg total) by mouth every 6 (six) hours as needed (Muscle spasm). 12 tablet 0  . HYDROCODONE-ACETAMINOPHEN 5-325 MG PO TABS Oral Take 2 tablets by mouth every 4 (four) hours as needed for pain. 20 tablet 0  . IBUPROFEN 600 MG PO TABS Oral Take 1 tablet (600 mg total) by mouth every 8 (eight) hours as needed for pain. 20 tablet 0    BP 104/75  Pulse 80  Temp(Src) 99.1 F (37.3 C) (Oral)  Resp 18  Ht 5\' 6"  (1.676 m)  Wt 155 lb (70.308 kg)  BMI 25.02 kg/m2  SpO2 100%  Physical Exam  Constitutional: She is oriented to person, place, and time. She appears well-developed and well-nourished. No distress.  HENT:  Head: Normocephalic and atraumatic.  Nose: Nose normal.  Mouth/Throat: Oropharynx is clear and moist.  Eyes: Conjunctivae and EOM are normal. Pupils are equal, round, and reactive to light.  Neck: Normal range of motion. Neck supple. No JVD present. No tracheal deviation present. No thyromegaly present.       Tender to palpation muscle spasm to left occipital skull, muscle spacticity to left superior  trapezius,  FROM through cervical spine   Cardiovascular: Normal rate, regular rhythm and normal heart sounds.  Exam reveals no gallop and no friction rub.   No murmur heard. Pulmonary/Chest: Effort normal and breath sounds normal. No stridor. No respiratory distress. She has no wheezes. She has no rales.  Musculoskeletal: Normal range of motion. She exhibits tenderness. She exhibits no edema.       Cervical back: She exhibits pain and spasm. She exhibits normal range of motion, no tenderness, no bony tenderness, no swelling, no edema and no deformity.       No vetrebral tenderness,symmetrical lack of DTR of patellar tendons, plantar and dorsal flexion intact, equal strength of bilateral LE 5/5.  Palpable muscle spasm at the  bilateral cervical paraspinal muscles and left superior trapezius. Pressing on the muscles at these locations reproduces the patient's pain as does turning her head to the right.  Lymphadenopathy:    She has no cervical adenopathy.  Neurological: She is alert and oriented to person, place, and time.  Skin: Skin is warm, dry and intact.  Psychiatric: She has a normal mood and affect. Her behavior is normal.    ED Course  Procedures (including critical care time)   DIAGNOSTIC STUDIES: Oxygen Saturation is 100% on room air, normal by my interpretation.     COORDINATION OF CARE: 7:29 PM  Physical exam complete.  Plan to discharge patient.  Patient agrees with plan.      LABS / RADIOLOGY:   Labs Reviewed - No data to display No results found.       MDM   Patient has apparent cervical muscle spasm.  No suggestion of meningitis and no previous injury. No suggestion of need for xray's.      MEDICATIONS GIVEN IN THE E.D. Scheduled Meds:    . diazepam  5 mg Oral Once  . HYDROcodone-acetaminophen  2 tablet Oral Once  . ibuprofen  800 mg Oral Once   Continuous Infusions:      IMPRESSION: 1. Cervical paraspinal muscle spasm      DISCHARGE MEDICATIONS: New Prescriptions   DIAZEPAM (VALIUM) 5 MG TABLET    Take 1 tablet (5 mg total) by mouth every 6 (six) hours as needed (Muscle spasm).   HYDROCODONE-ACETAMINOPHEN (NORCO) 5-325 MG PER TABLET    Take 2 tablets by mouth every 4 (four) hours as needed for pain.   IBUPROFEN (ADVIL,MOTRIN) 600 MG TABLET    Take 1 tablet (600 mg total) by mouth every 8 (eight) hours as needed for pain.      I personally performed the services described in this documentation, which was scribed in my presence. The recorded information has been reviewed and considered.            Felisa Bonier, MD 01/05/11 [phone]

## 2011-01-05 NOTE — ED Notes (Signed)
Pt states that her neck has been hurting for a week. Worse past 2 days. Swollen.

## 2011-01-08 ENCOUNTER — Emergency Department (HOSPITAL_BASED_OUTPATIENT_CLINIC_OR_DEPARTMENT_OTHER)
Admission: EM | Admit: 2011-01-08 | Discharge: 2011-01-08 | Disposition: A | Discharge status: Home / Self Care | Payer: Self-pay | Attending: Emergency Medicine | Admitting: Emergency Medicine

## 2011-01-08 ENCOUNTER — Encounter (HOSPITAL_BASED_OUTPATIENT_CLINIC_OR_DEPARTMENT_OTHER): Payer: Self-pay | Admitting: Emergency Medicine

## 2011-01-08 DIAGNOSIS — E079 Disorder of thyroid, unspecified: Secondary | ICD-10-CM | POA: Insufficient documentation

## 2011-01-08 DIAGNOSIS — F17200 Nicotine dependence, unspecified, uncomplicated: Secondary | ICD-10-CM | POA: Insufficient documentation

## 2011-01-08 DIAGNOSIS — L259 Unspecified contact dermatitis, unspecified cause: Secondary | ICD-10-CM | POA: Insufficient documentation

## 2011-01-08 DIAGNOSIS — L309 Dermatitis, unspecified: Secondary | ICD-10-CM

## 2011-01-08 DIAGNOSIS — T7840XA Allergy, unspecified, initial encounter: Secondary | ICD-10-CM

## 2011-01-08 MED ORDER — DEXAMETHASONE SODIUM PHOSPHATE 10 MG/ML IJ SOLN
10.0000 mg | Freq: Once | INTRAMUSCULAR | Status: AC
  Administered 2011-01-08: 10 mg via INTRAMUSCULAR
  Filled 2011-01-08: qty 1

## 2011-01-08 MED ORDER — DIPHENHYDRAMINE HCL 50 MG/ML IJ SOLN
50.0000 mg | Freq: Once | INTRAMUSCULAR | Status: AC
  Administered 2011-01-08: 50 mg via INTRAMUSCULAR
  Filled 2011-01-08: qty 1

## 2011-01-08 MED ORDER — OXYCODONE-ACETAMINOPHEN 5-325 MG PO TABS: 2.0000 | ORAL_TABLET | ORAL | Status: AC | PRN

## 2011-01-08 NOTE — ED Provider Notes (Signed)
History     CSN: [id number] Arrival date & time: 01/08/2011  9:11 PM   First MD Initiated Contact with Patient 01/08/11 2132      Chief Complaint  Patient presents with  . Rash  Diffuse rash to body with itching and burning. Possibly due to 3 new meds received Sunday for neck pain:  Hydrocodone, valium, and ibuprofen.   ? New soap at home.  No change in diet.  No exposures.  No tongue or lip swelling.   (Consider location/radiation/quality/duration/timing/severity/associated sxs/prior treatment) HPI  Past Medical History  Diagnosis Date  . Thyroid disease   . Ovarian cyst     Past Surgical History  Procedure Date  . Abdominal hysterectomy     No family history on file.  History  Substance Use Topics  . Smoking status: Current Everyday Smoker  . Smokeless tobacco: Not on file  . Alcohol Use: No    OB History    Grav Para Term Preterm Abortions TAB SAB Ect Mult Living   2 2        2       Review of Systems  All other systems reviewed and are negative.    Allergies  Onion  Home Medications   Current Outpatient Rx  Name Route Sig Dispense Refill  . ALBUTEROL SULFATE HFA 108 (90 BASE) MCG/ACT IN AERS Inhalation Inhale 2 puffs into the lungs every 6 (six) hours as needed. For shortness of breath and wheezing     . DIAZEPAM 5 MG PO TABS Oral Take 5 mg by mouth every 6 (six) hours as needed. For muscle spasms     . HYDROCODONE-ACETAMINOPHEN 5-325 MG PO TABS Oral Take 2 tablets by mouth every 4 (four) hours as needed for pain. 20 tablet 0  . IBUPROFEN 600 MG PO TABS Oral Take 1 tablet (600 mg total) by mouth every 8 (eight) hours as needed for pain. 20 tablet 0  . NAPROXEN SODIUM 220 MG PO TABS Oral Take 440 mg by mouth 2 (two) times daily with a meal.        BP 110/73  Pulse 66  Temp(Src) 97.9 F (36.6 C) (Oral)  Resp 18  SpO2 99%  Physical Exam  Nursing note and vitals reviewed. Constitutional: She is oriented to person, place, and time. She appears  well-developed and well-nourished.  HENT:  Head: Normocephalic and atraumatic.  Eyes: Conjunctivae and EOM are normal. Pupils are equal, round, and reactive to light.  Neck: Neck supple.  Cardiovascular: Normal rate and regular rhythm.  Exam reveals no gallop and no friction rub.   No murmur heard. Pulmonary/Chest: Breath sounds normal. She has no wheezes. She has no rales. She exhibits no tenderness.  Abdominal: Soft. Bowel sounds are normal. She exhibits no distension. There is no tenderness. There is no rebound and no guarding.  Musculoskeletal: Normal range of motion.  Neurological: She is alert and oriented to person, place, and time. No cranial nerve deficit. Coordination normal.  Skin: Skin is warm and dry. Rash noted.       Diffuse maculopapular rash to arms, legs, trunk.  No vesicles.  No bug bites.   Psychiatric: She has a normal mood and affect.    ED Course  Procedures (including critical care time)  Labs Reviewed - No data to display No results found.   No diagnosis found.    MDM  Pt is seen and examined;  Initial history and physical completed.  Will follow.  Dashan Chizmar A. Patrica Duel, MD 01/08/11 2138

## 2011-01-08 NOTE — ED Notes (Signed)
Pt states she has taken 2 vicodin and 1 valium earlier today. Pt was seen here sun. Pt appears under the influence of narcotics while in triage.

## 2011-01-08 NOTE — ED Notes (Signed)
Pt reports rash all over body starting today. Pt reports rash itches and burns.

## 2011-06-08 ENCOUNTER — Encounter (HOSPITAL_BASED_OUTPATIENT_CLINIC_OR_DEPARTMENT_OTHER): Payer: Self-pay | Admitting: *Deleted

## 2011-06-08 ENCOUNTER — Emergency Department (HOSPITAL_BASED_OUTPATIENT_CLINIC_OR_DEPARTMENT_OTHER): Payer: Self-pay

## 2011-06-08 ENCOUNTER — Emergency Department (HOSPITAL_BASED_OUTPATIENT_CLINIC_OR_DEPARTMENT_OTHER)
Admission: EM | Admit: 2011-06-08 | Discharge: 2011-06-08 | Discharge status: Against Medical Advice | Payer: Self-pay | Attending: Emergency Medicine | Admitting: Emergency Medicine

## 2011-06-08 DIAGNOSIS — E079 Disorder of thyroid, unspecified: Secondary | ICD-10-CM | POA: Insufficient documentation

## 2011-06-08 DIAGNOSIS — F17200 Nicotine dependence, unspecified, uncomplicated: Secondary | ICD-10-CM | POA: Insufficient documentation

## 2011-06-08 DIAGNOSIS — M2540 Effusion, unspecified joint: Secondary | ICD-10-CM | POA: Insufficient documentation

## 2011-06-08 DIAGNOSIS — IMO0001 Reserved for inherently not codable concepts without codable children: Secondary | ICD-10-CM | POA: Insufficient documentation

## 2011-06-08 DIAGNOSIS — M542 Cervicalgia: Secondary | ICD-10-CM | POA: Insufficient documentation

## 2011-06-08 HISTORY — DX: Methicillin resistant Staphylococcus aureus infection, unspecified site: A49.02

## 2011-06-08 NOTE — ED Notes (Signed)
Patient stated that she had to pick up her children and did not want to wait to be treated. Explained the process of treatment and approximate time required and patient stated she did not want to wait. Patient signed AMA discharge form

## 2011-06-08 NOTE — ED Notes (Addendum)
Pt states she has a painful knot on the right side of her neck.

## 2011-06-08 NOTE — ED Provider Notes (Signed)
History/physical exam/procedure(s) were performed by non-physician practitioner and as supervising physician I was immediately available for consultation/collaboration. I have reviewed all notes and am in agreement with care and plan.   Antonique Langford S Jermany Rimel, MD 06/08/11 1941 

## 2011-06-08 NOTE — ED Provider Notes (Signed)
History     CSN: [id number]  Arrival date & time 06/08/11  1708   First MD Initiated Contact with Patient 06/08/11 1742      Chief Complaint  Patient presents with  . Neck Pain    (Consider location/radiation/quality/duration/timing/severity/associated sxs/prior treatment) Patient is a 36 y.o. female presenting with neck pain. The history is provided by the patient. No language interpreter was used.  Neck Pain  This is a new problem. The current episode started 12 to 24 hours ago. The problem occurs constantly. The problem has been gradually worsening. The pain is associated with nothing. The maximum temperature recorded prior to her arrival was 100 to 100.9 F. The quality of the pain is described as stabbing. The pain is moderate. The pain is the same all the time. Stiffness is present all day. She has tried nothing for the symptoms. The treatment provided moderate relief.  Pt complains of pain in her neck.  Pt reports neck is stiff and painful  Past Medical History  Diagnosis Date  . Thyroid disease   . Ovarian cyst   . MRSA (methicillin resistant Staphylococcus aureus)     Past Surgical History  Procedure Date  . Abdominal hysterectomy     History reviewed. No pertinent family history.  History  Substance Use Topics  . Smoking status: Current Everyday Smoker  . Smokeless tobacco: Not on file  . Alcohol Use: No    OB History    Grav Para Term Preterm Abortions TAB SAB Ect Mult Living   2 2        2       Review of Systems  HENT: Positive for neck pain.   Musculoskeletal: Positive for myalgias and joint swelling.  All other systems reviewed and are negative.    Allergies  Onion  Home Medications   Current Outpatient Rx  Name Route Sig Dispense Refill  . ALBUTEROL SULFATE HFA 108 (90 BASE) MCG/ACT IN AERS Inhalation Inhale 2 puffs into the lungs every 6 (six) hours as needed. For shortness of breath and wheezing       BP 115/79  Pulse 72  Temp(Src)  97.5 F (36.4 C) (Oral)  Resp 20  Ht 5\' 6"  (1.676 m)  Wt 160 lb (72.576 kg)  BMI 25.82 kg/m2  SpO2 100%  Physical Exam  Constitutional: She appears well-developed.  HENT:  Head: Normocephalic and atraumatic.  Right Ear: External ear normal.  Left Ear: External ear normal.  Nose: Nose normal.  Mouth/Throat: Oropharynx is clear and moist.  Eyes: Conjunctivae and EOM are normal. Pupils are equal, round, and reactive to light.  Neck:       Decreased range of motion, no deformity,  No swelling  Cardiovascular: Normal rate.   Abdominal: Soft.  Neurological: She is alert.  Skin: Skin is warm.    ED Course  Procedures (including critical care time)  Labs Reviewed - No data to display No results found.   No diagnosis found.    MDM  Pt left before having xray.  Pt told nurse she had to pick up children        Lonia Skinner [place], [place] 06/08/11 1818

## 2011-09-23 ENCOUNTER — Encounter (HOSPITAL_BASED_OUTPATIENT_CLINIC_OR_DEPARTMENT_OTHER): Payer: Self-pay

## 2011-09-23 ENCOUNTER — Emergency Department (HOSPITAL_BASED_OUTPATIENT_CLINIC_OR_DEPARTMENT_OTHER): Payer: Self-pay

## 2011-09-23 ENCOUNTER — Emergency Department (HOSPITAL_BASED_OUTPATIENT_CLINIC_OR_DEPARTMENT_OTHER)
Admission: EM | Admit: 2011-09-23 | Discharge: 2011-09-23 | Disposition: A | Discharge status: Home / Self Care | Payer: Self-pay | Attending: Emergency Medicine | Admitting: Emergency Medicine

## 2011-09-23 DIAGNOSIS — H538 Other visual disturbances: Secondary | ICD-10-CM | POA: Insufficient documentation

## 2011-09-23 DIAGNOSIS — J3489 Other specified disorders of nose and nasal sinuses: Secondary | ICD-10-CM | POA: Insufficient documentation

## 2011-09-23 DIAGNOSIS — R202 Paresthesia of skin: Secondary | ICD-10-CM

## 2011-09-23 DIAGNOSIS — R209 Unspecified disturbances of skin sensation: Secondary | ICD-10-CM | POA: Insufficient documentation

## 2011-09-23 DIAGNOSIS — R51 Headache: Secondary | ICD-10-CM | POA: Insufficient documentation

## 2011-09-23 DIAGNOSIS — R0789 Other chest pain: Secondary | ICD-10-CM | POA: Insufficient documentation

## 2011-09-23 LAB — COMPREHENSIVE METABOLIC PANEL
Albumin: 4 g/dL (ref 3.5–5.2)
Alkaline Phosphatase: 60 U/L (ref 39–117)
BUN: 10 mg/dL (ref 6–23)
Calcium: 9.3 mg/dL (ref 8.4–10.5)
GFR calc Af Amer: 82 mL/min — ABNORMAL LOW (ref >90)
Glucose, Bld: 103 mg/dL — ABNORMAL HIGH (ref 70–99)
Potassium: 3.2 mEq/L — ABNORMAL LOW (ref 3.5–5.1)
Sodium: 136 mEq/L (ref 135–145)
Total Protein: 7.1 g/dL (ref 6.0–8.3)

## 2011-09-23 LAB — URINALYSIS, ROUTINE W REFLEX MICROSCOPIC
Ketones, ur: NEGATIVE mg/dL
Leukocytes, UA: NEGATIVE
Nitrite: NEGATIVE
Protein, ur: 30 mg/dL — AB
Urobilinogen, UA: 0.2 mg/dL (ref 0.0–1.0)

## 2011-09-23 LAB — CBC WITH DIFFERENTIAL/PLATELET
Basophils Relative: 0 % (ref 0–1)
Eosinophils Absolute: 0.2 10*3/uL (ref 0.0–0.7)
Eosinophils Relative: 2 % (ref 0–5)
MCH: 30.6 pg (ref 26.0–34.0)
MCHC: 35.8 g/dL (ref 30.0–36.0)
MCV: 85.7 fL (ref 78.0–100.0)
Monocytes Relative: 10 % (ref 3–12)
Neutrophils Relative %: 53 % (ref 43–77)
Platelets: 233 10*3/uL (ref 150–400)

## 2011-09-23 LAB — PREGNANCY, URINE: Preg Test, Ur: NEGATIVE

## 2011-09-23 LAB — URINE MICROSCOPIC-ADD ON

## 2011-09-23 LAB — TROPONIN I: Troponin I: <0.3 ng/mL (ref <0.30)

## 2011-09-23 LAB — RAPID URINE DRUG SCREEN, HOSP PERFORMED
Barbiturates: NOT DETECTED
Benzodiazepines: POSITIVE — AB

## 2011-09-23 MED ORDER — LORAZEPAM 1 MG PO TABS: 1.0000 mg | ORAL_TABLET | Freq: Three times a day (TID) | ORAL | Status: AC | PRN

## 2011-09-23 MED ORDER — SODIUM CHLORIDE 0.9 % IV BOLUS (SEPSIS): 1000.0000 mL | Freq: Once | INTRAVENOUS | Status: DC

## 2011-09-23 NOTE — ED Provider Notes (Signed)
History     CSN: [id number]  Arrival date & time 09/23/11  6962   First MD Initiated Contact with Patient 09/23/11 1852      Chief Complaint  Patient presents with  . Chest Pain  . Numbness  . Dizziness    (Consider location/radiation/quality/duration/timing/severity/associated sxs/prior treatment) HPI Comments: Patient presents with a multitude of complaints, including headache, chest pain, blurry vision, numb feet, numbness in the left arm.  This all started yesterday and has gotten worse.  She denies fevers or chills.  No nausea, vomiting, or diarrhea.  There are no aggravating or alleviating factors.  She denies alcohol or drug use.  The history is provided by the patient.    Past Medical History  Diagnosis Date  . Thyroid disease   . Ovarian cyst   . MRSA (methicillin resistant Staphylococcus aureus)     Past Surgical History  Procedure Date  . Abdominal hysterectomy     No family history on file.  History  Substance Use Topics  . Smoking status: Current Everyday Smoker -- 0.5 packs/day  . Smokeless tobacco: Not on file  . Alcohol Use: No    OB History    Grav Para Term Preterm Abortions TAB SAB Ect Mult Living   2 2        2       Review of Systems  Constitutional: Positive for fatigue. Negative for fever.  Respiratory: Positive for shortness of breath.   Cardiovascular: Positive for chest pain.  Neurological: Positive for dizziness, speech difficulty, weakness, light-headedness, numbness and headaches.  All other systems reviewed and are negative.    Allergies  Onion  Home Medications   Current Outpatient Rx  Name Route Sig Dispense Refill  . ALBUTEROL SULFATE HFA 108 (90 BASE) MCG/ACT IN AERS Inhalation Inhale 2 puffs into the lungs every 6 (six) hours as needed. For shortness of breath and wheezing       Ht 5\' 6"  (1.676 m)  Wt 154 lb (69.854 kg)  BMI 24.86 kg/m2  Physical Exam  Nursing note and vitals reviewed. Constitutional: She is  oriented to person, place, and time. She appears well-developed and well-nourished. No distress.       Patient is very fidgety and will not sit still.  She is other wise alert and oriented.  HENT:  Head: Normocephalic and atraumatic.  Neck: Normal range of motion. Neck supple.  Cardiovascular: Normal rate and regular rhythm.  Exam reveals no gallop and no friction rub.   No murmur heard. Pulmonary/Chest: Effort normal and breath sounds normal. No respiratory distress. She has no wheezes.  Abdominal: Soft. Bowel sounds are normal. She exhibits no distension. There is no tenderness.  Musculoskeletal: Normal range of motion.  Neurological: She is alert and oriented to person, place, and time.  Skin: Skin is warm and dry. She is not diaphoretic.    ED Course  Procedures (including critical care time)   Labs Reviewed  CBC WITH DIFFERENTIAL  COMPREHENSIVE METABOLIC PANEL  TROPONIN I  ETHANOL  URINALYSIS, ROUTINE W REFLEX MICROSCOPIC  PREGNANCY, URINE  URINE RAPID DRUG SCREEN (HOSP PERFORMED)   No results found.   No diagnosis found.   Date: 09/23/2011  Rate: 79  Rhythm: normal sinus rhythm  QRS Axis: normal  Intervals: normal  ST/T Wave abnormalities: normal  Conduction Disutrbances:none  Narrative Interpretation:   Old EKG Reviewed: unchanged    MDM  This patient presents here with symptoms that are multiple and seemingly unrelated.  The workup  is unremarkable with the exception of her tox screen which is positive for cocaine.  This may well be the cause of all of this.  She denies to me that she has used this.  She will be discharged to home with ativan prn, follow up as needed.        Geoffery Lyons, MD 09/23/11 2043

## 2011-09-23 NOTE — ED Notes (Signed)
Patient unable to remain still on the stretcher. Asked her if she had an IV, to which she replied "no" even though she had an IV in her left AC. States that she is still unable to give a urine sample.

## 2011-09-23 NOTE — Discharge Instructions (Signed)
Chest Pain (Nonspecific) It is often hard to give a specific diagnosis for the cause of chest pain. There is always a chance that your pain could be related to something serious, such as a heart attack or a blood clot in the lungs. You need to follow up with your caregiver for further evaluation. CAUSES   Heartburn.   Pneumonia or bronchitis.   Anxiety or stress.   Inflammation around your heart (pericarditis) or lung (pleuritis or pleurisy).   A blood clot in the lung.   A collapsed lung (pneumothorax). It can develop suddenly on its own (spontaneous pneumothorax) or from injury (trauma) to the chest.   Shingles infection (herpes zoster virus).  The chest wall is composed of bones, muscles, and cartilage. Any of these can be the source of the pain.  The bones can be bruised by injury.   The muscles or cartilage can be strained by coughing or overwork.   The cartilage can be affected by inflammation and become sore (costochondritis).  DIAGNOSIS  Lab tests or other studies, such as X-rays, electrocardiography, stress testing, or cardiac imaging, may be needed to find the cause of your pain.  TREATMENT   Treatment depends on what may be causing your chest pain. Treatment may include:   Acid blockers for heartburn.   Anti-inflammatory medicine.   Pain medicine for inflammatory conditions.   Antibiotics if an infection is present.   You may be advised to change lifestyle habits. This includes stopping smoking and avoiding alcohol, caffeine, and chocolate.   You may be advised to keep your head raised (elevated) when sleeping. This reduces the chance of acid going backward from your stomach into your esophagus.   Most of the time, nonspecific chest pain will improve within 2 to 3 days with rest and mild pain medicine.  HOME CARE INSTRUCTIONS   If antibiotics were prescribed, take your antibiotics as directed. Finish them even if you start to feel better.   For the next few  days, avoid physical activities that bring on chest pain. Continue physical activities as directed.   Do not smoke.   Avoid drinking alcohol.   Only take over-the-counter or prescription medicine for pain, discomfort, or fever as directed by your caregiver.   Follow your caregiver's suggestions for further testing if your chest pain does not go away.   Keep any follow-up appointments you made. If you do not go to an appointment, you could develop lasting (chronic) problems with pain. If there is any problem keeping an appointment, you must call to reschedule.  SEEK MEDICAL CARE IF:   You think you are having problems from the medicine you are taking. Read your medicine instructions carefully.   Your chest pain does not go away, even after treatment.   You develop a rash with blisters on your chest.  SEEK IMMEDIATE MEDICAL CARE IF:   You have increased chest pain or pain that spreads to your arm, neck, jaw, back, or abdomen.   You develop shortness of breath, an increasing cough, or you are coughing up blood.   You have severe back or abdominal pain, feel nauseous, or vomit.   You develop severe weakness, fainting, or chills.   You have a fever.  THIS IS AN EMERGENCY. Do not wait to see if the pain will go away. Get medical help at once. Call your local emergency services (911 in [place] Do not drive yourself to the hospital. MAKE SURE YOU:   Understand these instructions.     Will watch your condition.   Will get help right away if you are not doing well or get worse.  Document Released: 10/23/2004 Document Revised: 01/02/2011 Document Reviewed: 08/19/2007 ExitCare Patient Information 2012 [employer]. 

## 2011-09-23 NOTE — ED Notes (Signed)
Pt reports onset of midsternal chest pain radiating to left arm that started yesterday.  She aslo reports "numbness" in left arm.  She appears impaired and cannot sit still during triage process.

## 2013-11-28 ENCOUNTER — Encounter (HOSPITAL_BASED_OUTPATIENT_CLINIC_OR_DEPARTMENT_OTHER): Payer: Self-pay

## 2014-07-18 ENCOUNTER — Other Ambulatory Visit (HOSPITAL_COMMUNITY): Payer: Self-pay | Admitting: *Deleted

## 2014-07-18 DIAGNOSIS — N6002 Solitary cyst of left breast: Secondary | ICD-10-CM

## 2014-07-21 ENCOUNTER — Other Ambulatory Visit (HOSPITAL_COMMUNITY): Payer: Self-pay | Admitting: *Deleted

## 2014-07-21 DIAGNOSIS — N6002 Solitary cyst of left breast: Secondary | ICD-10-CM

## 2014-07-26 ENCOUNTER — Other Ambulatory Visit (HOSPITAL_COMMUNITY): Payer: Self-pay | Admitting: *Deleted

## 2014-07-26 DIAGNOSIS — N6002 Solitary cyst of left breast: Secondary | ICD-10-CM

## 2014-07-28 ENCOUNTER — Other Ambulatory Visit (HOSPITAL_COMMUNITY): Payer: Self-pay | Admitting: *Deleted

## 2014-07-28 DIAGNOSIS — R229 Localized swelling, mass and lump, unspecified: Principal | ICD-10-CM

## 2014-07-28 DIAGNOSIS — N6002 Solitary cyst of left breast: Secondary | ICD-10-CM

## 2014-07-28 DIAGNOSIS — IMO0002 Reserved for concepts with insufficient information to code with codable children: Secondary | ICD-10-CM

## 2014-08-01 ENCOUNTER — Inpatient Hospital Stay (HOSPITAL_COMMUNITY): Admission: RE | Admit: 2014-08-01 | Payer: PRIVATE HEALTH INSURANCE | Source: Ambulatory Visit

## 2014-08-01 ENCOUNTER — Encounter (HOSPITAL_COMMUNITY): Payer: PRIVATE HEALTH INSURANCE

## 2014-08-01 ENCOUNTER — Ambulatory Visit (HOSPITAL_COMMUNITY): Payer: PRIVATE HEALTH INSURANCE

## 2014-08-01 ENCOUNTER — Ambulatory Visit (HOSPITAL_COMMUNITY): Admission: RE | Admit: 2014-08-01 | Payer: PRIVATE HEALTH INSURANCE | Source: Ambulatory Visit

## 2014-08-01 ENCOUNTER — Ambulatory Visit (HOSPITAL_COMMUNITY): Admission: RE | Admit: 2014-08-01 | Payer: Self-pay | Source: Ambulatory Visit

## 2014-08-03 ENCOUNTER — Encounter (HOSPITAL_COMMUNITY): Payer: PRIVATE HEALTH INSURANCE

## 2014-08-15 ENCOUNTER — Other Ambulatory Visit (HOSPITAL_COMMUNITY): Payer: Self-pay | Admitting: *Deleted

## 2014-08-15 ENCOUNTER — Ambulatory Visit (HOSPITAL_COMMUNITY)
Admission: RE | Admit: 2014-08-15 | Discharge: 2014-08-15 | Disposition: A | Discharge status: Home / Self Care | Payer: PRIVATE HEALTH INSURANCE | Source: Ambulatory Visit | Attending: *Deleted | Admitting: *Deleted

## 2014-08-15 ENCOUNTER — Ambulatory Visit (HOSPITAL_COMMUNITY): Payer: Self-pay

## 2014-08-15 DIAGNOSIS — N6002 Solitary cyst of left breast: Secondary | ICD-10-CM

## 2014-08-15 DIAGNOSIS — N644 Mastodynia: Secondary | ICD-10-CM | POA: Insufficient documentation

## 2014-08-15 MED ORDER — LIDOCAINE HCL (PF) 2 % IJ SOLN
INTRAMUSCULAR | Status: AC
  Filled 2014-08-15: qty 10

## 2014-08-15 NOTE — Discharge Instructions (Addendum)
Breast Biopsy °Care After °These instructions give you information on caring for yourself after your procedure. Your doctor may also give you more specific instructions. Call your doctor if you have any problems or questions after your procedure. °HOME CARE °· Only take medicine as told by your doctor. °· Do not take aspirin. °· Keep your sutures (stitches) dry when bathing. °· Protect the biopsy area. Do not let the area get bumped. °· Avoid activities that could pull the biopsy site open until your doctor approves. This includes: °· Stretching. °· Reaching. °· Exercise. °· Sports. °· Lifting more than 3lb. °· Continue your normal diet. °· Wear a good support bra for as long as told by your doctor. °· Change any bandages (dressings) as told by your doctor. °· Do not drink alcohol while taking pain medicine. °· Keep all doctor visits as told. Ask when your test results will be ready. Make sure you get your test results. °GET HELP RIGHT AWAY IF:  °· You have a fever. °· You have more bleeding (more than a small spot) from the biopsy site. °· You have trouble breathing. °· You have yellowish-white fluid (pus) coming from the biopsy site. °· You have redness, puffiness (swelling), or more pain in the biopsy site. °· You have a bad smell coming from the biopsy site. °· Your biopsy site opens after sutures, staples, or sticky strips have been removed. °· You have a rash. °· You need stronger medicine. °MAKE SURE YOU: °· Understand these instructions. °· Will watch your condition. °· Will get help right away if you are not doing well or get worse. °Document Released: 11/09/2008 Document Revised: 04/07/2011 Document Reviewed: 02/23/2011 °ExitCare® Patient Information ©2015 [employer]. This information is not intended to replace advice given to you by your health care provider. Make sure you discuss any questions you have with your health care provider. ° °Breast Biopsy °A breast biopsy is a test during which a sample of  tissue is taken from your breast. The breast tissue is looked at under a microscope for cancer cells.  °BEFORE THE PROCEDURE °· Make plans to have someone drive you home after the test. °· Do not smoke for 2 weeks before the test. Stop smoking, if you smoke. °· Do not drink alcohol for 24 hours before the test. °· Wear a good support bra to the test. °PROCEDURE  °You may be given one of the following: °· A medicine to numb the breast area (local anesthetic). °· A medicine to make you fall asleep (general anesthetic). °There are different types of breast biopsies. They include: °· Fine-needle aspiration. °¨ A needle is put into the breast lump. °¨ The needle takes out fluid and cells from the lump. °¨ Ultrasound imaging may be used to help find the lump and to put the needle in the right spot. °· Core-needle biopsy. °¨ A needle is put into the breast lump. °¨ The needle is put in your breast 3-6 times. °¨ The needle removes breast tissue. °¨ An ultrasound image or X-ray is often used to find the right spot to put in the needle. °· Stereotactic biopsy. °¨ X-rays and a computer are used to study X-ray pictures of the breast lump. °¨ The computer finds where the needle needs to be put into the breast. °¨ Tissue samples are taken out. °· Vacuum-assisted biopsy. °¨ A small cut (incision) is made in your breast. °¨ A biopsy device is put through the cut and into the breast   tissue. °¨ The biopsy device draws abnormal breast tissue into the biopsy device. °¨ A large tissue sample is often removed. °¨ No stitches are needed. °· Ultrasound-guided core-needle biopsy. °¨ Ultrasound imaging helps guide the needle into the area of the breast that is not normal. °¨ A cut is made in the breast. The needle is put into the breast lump. °¨ Tissue samples are taken out. °· Open biopsy. °¨ A large cut is made in the breast. °¨ Your doctor will try to remove the whole breast lump or as much as possible. °All tissue, fluid, or cell samples  are looked at under a microscope.  °AFTER THE PROCEDURE °· You will be taken to an area to recover. You will be able to go home once you are doing well and are without problems. °· You may have bruising on your breast. This is normal. °· A pressure bandage (dressing) may be put on your breast for 24-48 hours. This type of bandage is wrapped tightly around your chest. It helps stop fluid from building up underneath tissues. °Document Released: 04/07/2011 Document Revised: 05/30/2013 Document Reviewed: 04/07/2011 °ExitCare® Patient Information ©2015 [employer]. This information is not intended to replace advice given to you by your health care provider. Make sure you discuss any questions you have with your health care provider. ° °

## 2015-02-18 ENCOUNTER — Emergency Department (HOSPITAL_COMMUNITY)
Admission: EM | Admit: 2015-02-18 | Discharge: 2015-02-18 | Disposition: A | Discharge status: Home / Self Care | Payer: PRIVATE HEALTH INSURANCE | Attending: Emergency Medicine | Admitting: Emergency Medicine

## 2015-02-18 ENCOUNTER — Encounter (HOSPITAL_COMMUNITY): Payer: Self-pay | Admitting: *Deleted

## 2015-02-18 DIAGNOSIS — Z8639 Personal history of other endocrine, nutritional and metabolic disease: Secondary | ICD-10-CM | POA: Insufficient documentation

## 2015-02-18 DIAGNOSIS — F17200 Nicotine dependence, unspecified, uncomplicated: Secondary | ICD-10-CM | POA: Insufficient documentation

## 2015-02-18 DIAGNOSIS — N6002 Solitary cyst of left breast: Secondary | ICD-10-CM | POA: Insufficient documentation

## 2015-02-18 DIAGNOSIS — Z8614 Personal history of Methicillin resistant Staphylococcus aureus infection: Secondary | ICD-10-CM | POA: Insufficient documentation

## 2015-02-18 MED ORDER — NAPROXEN 500 MG PO TABS: 500.0000 mg | ORAL_TABLET | Freq: Two times a day (BID) | ORAL | Status: AC

## 2015-02-18 MED ORDER — OXYCODONE-ACETAMINOPHEN 5-325 MG PO TABS
1.0000 | ORAL_TABLET | Freq: Once | ORAL | Status: AC
  Administered 2015-02-18: 1 via ORAL
  Filled 2015-02-18: qty 1

## 2015-02-18 NOTE — ED Notes (Signed)
Pt states that she a "knot" in left breast area that started three days ago,

## 2015-02-18 NOTE — ED Provider Notes (Signed)
CSN: [id number]     Arrival date & time 02/18/15  2040 History   First MD Initiated Contact with Patient 02/18/15 2114     Chief Complaint  Patient presents with  . Breast Pain     (Consider location/radiation/quality/duration/timing/severity/associated sxs/prior Treatment) HPI [name] is a 40 y.o. female who presents to the ED with left breast pain and feeling a tiny knot in the breast in the area of tenderness. She has had breast cyst in the past and saw Dr. Lovell Sheehan and he aspirated the cyst and it went away. She had a mammogram last year in [place] and it was normal. She denies fever, chills or any other problems.   Past Medical History  Diagnosis Date  . Thyroid disease   . Ovarian cyst   . MRSA (methicillin resistant Staphylococcus aureus)    Past Surgical History  Procedure Laterality Date  . Abdominal hysterectomy     No family history on file. Social History  Substance Use Topics  . Smoking status: Current Every Day Smoker -- 0.50 packs/day  . Smokeless tobacco: None  . Alcohol Use: No   OB History    Gravida Para Term Preterm AB TAB SAB Ectopic Multiple Living   Review of Systems  Skin:       Knot in left breast  all other systems negative    Allergies  Onion  Home Medications   Prior to Admission medications   Medication Sig Start Date End Date Taking? Authorizing Provider  dextromethorphan-guaiFENesin (MUCINEX DM) 30-600 MG per 12 hr tablet Take 1 tablet by mouth every 12 (twelve) hours. For congestion.    Historical Provider, MD  naproxen (NAPROSYN) 500 MG tablet Take 1 tablet (500 mg total) by mouth 2 (two) times daily. 02/18/15   Joette Schmoker Orlene Och, NP   BP 129/82 mmHg  Pulse 67  Temp(Src) 98.5 F (36.9 C) (Oral)  Resp 18  Ht  (1.676 m)  Wt 74.447 kg  BMI 26.50 kg/m2  SpO2 98% Physical Exam  Constitutional: She is oriented to person, place, and time. She appears well-developed and well-nourished. No distress.  HENT:    Head: Normocephalic.  Eyes: EOM are normal.  Neck: Normal range of motion. Neck supple.  Cardiovascular: Normal rate.   Pulmonary/Chest: Effort normal. Right breast exhibits no inverted nipple, no mass, no nipple discharge, no skin change and no tenderness. Left breast exhibits mass and tenderness. Left breast exhibits no inverted nipple, no nipple discharge and no skin change.    BB size node left breast at 5 o'clock. Tender on palpation.   Abdominal: She exhibits no distension.  Musculoskeletal: Normal range of motion.  Neurological: She is alert and oriented to person, place, and time. No cranial nerve deficit.  Skin: Skin is warm and dry.  Psychiatric: She has a normal mood and affect. Her behavior is normal.  Nursing note and vitals reviewed.   ED Course  Procedures   MDM  40 y.o. female with left breast tenderness and BB size node palpated. Stable for d/c without red streaking, no erythema noted, no fever, no drainage from nipple. She will follow up with Dr. Lovell Sheehan since he has seen her for same problem in the past. Discussed with the patient and all questioned fully answered. She will return here as needed if any problems arise.   Final diagnoses:  Breast cyst, left  [place], [place] 02/18/15 2145  Mancel Bale, MD 02/19/15 [phone]

## 2015-02-18 NOTE — Discharge Instructions (Signed)
Call Dr. Lovell Sheehan for follow up.   Breast Cyst A breast cyst is a sac in the breast that is filled with fluid. Breast cysts are common in women. Women can have one or many cysts. When the breasts contain many cysts, it is usually due to a noncancerous (benign) condition called fibrocystic change. These lumps form under the influence of female hormones (estrogen and progesterone). The lumps are most often located in the upper, outer portion of the breast. They are often more swollen, painful, and tender before your period starts. They usually disappear after menopause, unless you are on hormone therapy.  There are several types of cysts:  Macrocyst. This is a cyst that is about 2 in. (5.1 cm) in diameter.   Microcyst. This is a tiny cyst that you cannot feel but can be seen with a mammogram or an ultrasound.   Galactocele. This is a cyst containing milk that may develop if you suddenly stop breastfeeding.   Sebaceous cyst of the skin. This type of cyst is not in the breast tissue itself. Breast cysts do not increase your risk of breast cancer. However, they must be monitored closely because they can be cancerous.  CAUSES  It is not known exactly what causes a breast cyst to form. Possible causes include:  An overgrowth of milk glands and connective tissue in the breast can block the milk glands, causing them to fill with fluid.   Scar tissue in the breast from previous surgery may block the glands, causing a cyst.  RISK FACTORS Estrogen may influence the development of a breast cyst.  SIGNS AND SYMPTOMS   Feeling a smooth, round, soft lump (like a grape) in the breast that is easily moveable.   Breast discomfort or pain.  Increase in size of the lump before your menstrual period and decrease in its size after your menstrual period.  DIAGNOSIS  A cyst can be felt during a physical exam by your health care provider. A breast X-ray exam (mammogram) and ultrasonography will be done  to confirm the diagnosis. Fluid may be removed from the cyst with a needle (fine needle aspiration) to make sure the cyst is not cancerous.  TREATMENT  Treatment may not be necessary. Your health care provider may monitor the cyst to see if it goes away on its own. If treatment is needed, it may include:  Hormone treatment.   Needle aspiration. There is a chance of the cyst coming back after aspiration.   Surgery to remove the whole cyst.  HOME CARE INSTRUCTIONS   Keep all follow-up appointments with your health care provider.  See your health care provider regularly:  Get a yearly exam by your health care provider.  Have a clinical breast exam by a health care provider every 1-3 years if you are 10-22 years of age. After age 32 years, you should have the exam every year.   Get mammogram tests as directed by your health care provider.   Understand the normal appearance and feel of your breasts and perform breast self-exams.   Only take over-the-counter or prescription medicines as directed by your health care provider.   Wear a supportive bra, especially when exercising.   Avoid caffeine.   Reduce your salt intake, especially before your menstrual period. Too much salt can cause fluid retention, breast swelling, and discomfort.  SEEK MEDICAL CARE IF:   You feel, or think you feel, a lump in your breast.   You notice that both breasts look  or feel different than usual.   Your breast is still causing pain after your menstrual period is over.   You need medicine for breast pain and swelling that occurs with your menstrual period.  SEEK IMMEDIATE MEDICAL CARE IF:   You have severe pain, tenderness, redness, or warmth in your breast.   You have nipple discharge or bleeding.   Your breast lump becomes hard and painful.   You find new lumps or bumps that were not there before.   You feel lumps in your armpit (axilla).   You notice dimpling or wrinkling  of the breast or nipple.   You have a fever.  MAKE SURE YOU:  Understand these instructions.  Will watch your condition.  Will get help right away if you are not doing well or get worse.   This information is not intended to replace advice given to you by your health care provider. Make sure you discuss any questions you have with your health care provider.   Document Released: 01/13/2005 Document Revised: 09/15/2012 Document Reviewed: 08/12/2012 Elsevier Interactive Patient Education [employer]

## 2015-02-18 NOTE — ED Notes (Signed)
Pt alert & oriented x4, stable gait. Patient given discharge instructions, paperwork & prescription(s). Patient  instructed to stop at the registration desk to finish any additional paperwork. Patient verbalized understanding. Pt left department w/ no further questions. 

## 2015-02-18 NOTE — ED Notes (Signed)
Pt complaining of left breast pain that started 3 days ago. No redness noticed. Sore to touch, has been taking ibuprofen w/ little pain relief. States had a mammogram last year.

## 2015-02-28 ENCOUNTER — Encounter (HOSPITAL_COMMUNITY): Payer: Self-pay | Admitting: Emergency Medicine

## 2015-02-28 ENCOUNTER — Emergency Department (HOSPITAL_COMMUNITY)
Admission: EM | Admit: 2015-02-28 | Discharge: 2015-02-28 | Disposition: A | Discharge status: Home / Self Care | Payer: PRIVATE HEALTH INSURANCE | Attending: Emergency Medicine | Admitting: Emergency Medicine

## 2015-02-28 DIAGNOSIS — Z8614 Personal history of Methicillin resistant Staphylococcus aureus infection: Secondary | ICD-10-CM | POA: Insufficient documentation

## 2015-02-28 DIAGNOSIS — Z8639 Personal history of other endocrine, nutritional and metabolic disease: Secondary | ICD-10-CM | POA: Insufficient documentation

## 2015-02-28 DIAGNOSIS — F17200 Nicotine dependence, unspecified, uncomplicated: Secondary | ICD-10-CM | POA: Insufficient documentation

## 2015-02-28 DIAGNOSIS — J40 Bronchitis, not specified as acute or chronic: Secondary | ICD-10-CM

## 2015-02-28 DIAGNOSIS — Z791 Long term (current) use of non-steroidal anti-inflammatories (NSAID): Secondary | ICD-10-CM | POA: Insufficient documentation

## 2015-02-28 DIAGNOSIS — Z8742 Personal history of other diseases of the female genital tract: Secondary | ICD-10-CM | POA: Insufficient documentation

## 2015-02-28 MED ORDER — BENZONATATE 100 MG PO CAPS: 100.0000 mg | ORAL_CAPSULE | Freq: Three times a day (TID) | ORAL | Status: AC

## 2015-02-28 MED ORDER — BENZONATATE 100 MG PO CAPS
100.0000 mg | ORAL_CAPSULE | Freq: Once | ORAL | Status: AC
  Administered 2015-02-28: 100 mg via ORAL
  Filled 2015-02-28: qty 1

## 2015-02-28 MED ORDER — ALBUTEROL SULFATE HFA 108 (90 BASE) MCG/ACT IN AERS
2.0000 | INHALATION_SPRAY | RESPIRATORY_TRACT | Status: DC
Start: 2015-02-28 — End: 2015-03-01
  Filled 2015-02-28: qty 6.7

## 2015-02-28 MED ORDER — PREDNISONE 20 MG PO TABS
40.0000 mg | ORAL_TABLET | Freq: Once | ORAL | Status: AC
  Administered 2015-02-28: 40 mg via ORAL
  Filled 2015-02-28: qty 2

## 2015-02-28 MED ORDER — PREDNISONE 20 MG PO TABS: 40.0000 mg | ORAL_TABLET | Freq: Every day | ORAL | Status: AC

## 2015-02-28 NOTE — ED Provider Notes (Signed)
CSN: [id number]     Arrival date & time 02/28/15  2148 History   First MD Initiated Contact with Patient 02/28/15 2204     Chief Complaint  Patient presents with  . Cough     (Consider location/radiation/quality/duration/timing/severity/associated sxs/prior Treatment) HPI  The patient is a 40 year old female, she has a history of smoking, she presents to the hospital after having coughing for the last 4 days, she states this is a dry cough, persistent, has associated hoarseness of her voice, there is no runny nose, no congestion, minimal sore throat. No fevers or chills, no phlegm, no swelling of the legs. She has not had any medications prior to arrival, she has not been smoking the last few days.  Past Medical History  Diagnosis Date  . Thyroid disease   . Ovarian cyst   . MRSA (methicillin resistant Staphylococcus aureus)    Past Surgical History  Procedure Laterality Date  . Abdominal hysterectomy     History reviewed. No pertinent family history. Social History  Substance Use Topics  . Smoking status: Current Every Day Smoker -- 0.50 packs/day  . Smokeless tobacco: None  . Alcohol Use: No   OB History    Gravida Para Term Preterm AB TAB SAB Ectopic Multiple Living   Review of Systems  All other systems reviewed and are negative.     Allergies  Onion  Home Medications   Prior to Admission medications   Medication Sig Start Date End Date Taking? Authorizing Provider  benzonatate (TESSALON) 100 MG capsule Take 1 capsule (100 mg total) by mouth every 8 (eight) hours. 02/28/15   Eber Hong, MD  dextromethorphan-guaiFENesin [facility] DM) 30-600 MG per 12 hr tablet Take 1 tablet by mouth every 12 (twelve) hours. For congestion.    Historical Provider, MD  naproxen (NAPROSYN) 500 MG tablet Take 1 tablet (500 mg total) by mouth 2 (two) times daily. 02/18/15   Hope Orlene Och, NP  predniSONE (DELTASONE) 20 MG tablet Take 2 tablets (40 mg total) by mouth  daily. 02/28/15   Eber Hong, MD   BP 144/88 mmHg  Pulse 66  Temp(Src) 98 F (36.7 C) (Oral)  Resp 20  Ht  (1.676 m)  Wt 164 lb (74.39 kg)  BMI 26.48 kg/m2  SpO2 98% Physical Exam  Constitutional: She appears well-developed and well-nourished. No distress.  HENT:  Head: Normocephalic and atraumatic.  Mouth/Throat: Oropharynx is clear and moist. No oropharyngeal exudate.   oropharynx is clear and moist, no exudate asymmetry hypertrophy or erythema, nasal passages are clear, tympanic membranes are clear. No trismus  Eyes: Conjunctivae and EOM are normal. Pupils are equal, round, and reactive to light. Right eye exhibits no discharge. Left eye exhibits no discharge. No scleral icterus.  Neck: Normal range of motion. Neck supple. No JVD present. No thyromegaly present.  No lymphadenopathy of the neck, no torticollis  Cardiovascular: Normal rate, regular rhythm, normal heart sounds and intact distal pulses.  Exam reveals no gallop and no friction rub.   No murmur heard. Pulmonary/Chest: Effort normal and breath sounds normal. No respiratory distress. She has no wheezes. She has no rales.  Despite occasional dry cough the lungs are clear, there is no wheezing rhonchi or rales  Abdominal: Soft. Bowel sounds are normal. She exhibits no distension and no mass. There is no tenderness.  Musculoskeletal: Normal range of motion. She exhibits no edema or tenderness.  Lymphadenopathy:  She has no cervical adenopathy.  Neurological: She is alert. Coordination normal.  Skin: Skin is warm and dry. No rash noted. No erythema.  Psychiatric: She has a normal mood and affect. Her behavior is normal.  Nursing note and vitals reviewed.   ED Course  Procedures (including critical care time) Labs Review Labs Reviewed - No data to display  Imaging Review No results found. I have personally reviewed and evaluated these images and lab results as part of my medical decision-making.    MDM    Final diagnoses:  Bronchitis    The patient is well-appearing, vital signs are totally normal, no tachycardia fever or hypoxia, no indication for chest imaging as she has no abnormal lung sounds, dry nonproductive cough, no fever, likely bronchitis, treated conservatively with prednisone, Tessalon, albuterol MDI the latter of which she was given to take home with her.  In addition to written d/c instructions, the pt was given verbal d/c instructions including the indications for return and expressed understanding to the instructions.     Eber Hong, MD 02/28/15 [phone]

## 2015-02-28 NOTE — Discharge Instructions (Signed)

## 2015-02-28 NOTE — ED Notes (Signed)
Pt c/o non productive cough x 2 days with rib soreness.

## 2016-02-20 ENCOUNTER — Encounter (HOSPITAL_COMMUNITY): Payer: Self-pay | Admitting: Emergency Medicine

## 2016-02-20 ENCOUNTER — Emergency Department (HOSPITAL_COMMUNITY)
Admission: EM | Admit: 2016-02-20 | Discharge: 2016-02-20 | Disposition: A | Discharge status: Home / Self Care | Payer: PRIVATE HEALTH INSURANCE | Attending: Emergency Medicine | Admitting: Emergency Medicine

## 2016-02-20 DIAGNOSIS — Z5321 Procedure and treatment not carried out due to patient leaving prior to being seen by health care provider: Secondary | ICD-10-CM | POA: Insufficient documentation

## 2016-02-20 DIAGNOSIS — R05 Cough: Secondary | ICD-10-CM | POA: Insufficient documentation

## 2016-02-20 NOTE — ED Triage Notes (Signed)
Pt. reports persistent productive cough , nasal congest/post nasal drip and generalized body aches onset yesterday , denies fever or chills .

## 2016-02-20 NOTE — ED Notes (Signed)
Called name multiple times with no response.

## 2016-02-20 NOTE — ED Notes (Signed)
Pt's name called 3 times in the waiting room.  No response. 

## 2017-12-07 ENCOUNTER — Other Ambulatory Visit: Payer: Self-pay | Admitting: Obstetrics and Gynecology

## 2017-12-07 ENCOUNTER — Encounter (HOSPITAL_COMMUNITY): Payer: Self-pay | Admitting: *Deleted

## 2017-12-07 ENCOUNTER — Inpatient Hospital Stay (HOSPITAL_COMMUNITY)
Admission: AD | Admit: 2017-12-07 | Discharge: 2017-12-07 | Disposition: A | Discharge status: Home / Self Care | Payer: PRIVATE HEALTH INSURANCE | Source: Ambulatory Visit | Attending: Internal Medicine | Admitting: Internal Medicine

## 2017-12-07 ENCOUNTER — Other Ambulatory Visit: Payer: Self-pay

## 2017-12-07 DIAGNOSIS — N644 Mastodynia: Secondary | ICD-10-CM

## 2017-12-07 DIAGNOSIS — N6310 Unspecified lump in the right breast, unspecified quadrant: Secondary | ICD-10-CM | POA: Diagnosis present

## 2017-12-07 DIAGNOSIS — Z90710 Acquired absence of both cervix and uterus: Secondary | ICD-10-CM | POA: Insufficient documentation

## 2017-12-07 DIAGNOSIS — F17210 Nicotine dependence, cigarettes, uncomplicated: Secondary | ICD-10-CM | POA: Insufficient documentation

## 2017-12-07 DIAGNOSIS — N6452 Nipple discharge: Secondary | ICD-10-CM | POA: Insufficient documentation

## 2017-12-07 MED ORDER — HYDROCODONE-IBUPROFEN 5-200 MG PO TABS: 1.0000 | ORAL_TABLET | Freq: Three times a day (TID) | ORAL | 0 refills | Status: DC | PRN

## 2017-12-07 NOTE — MAU Provider Note (Signed)
History     CSN: [id number]  Arrival date and time: 12/07/17 1329   First Provider Initiated Contact with Patient 12/07/17 1401      Chief Complaint  Patient presents with  . Breast Pain  . Breast Mass   HPI  Ms.  [name] is a 42 y.o. year old G50P2002 non-pregnant female who presents to MAU reporting a painful lump in RT breast that she first noted > 1 year ago, but it's larger now. She reports that the pain got worse about 2 wks ago. She has been taking ibuprofen for the pain, "but it doesn't seem to help it any more." She reports a clear brown discharge coming from the RT nipple. She also reports having some dimpling of the skin of the RT breast. She has 2 aunts that were dx'd with Br Ca in their 30's and 40's and a GM that was dx'd in her 38s. She last had a "normal" mammogram in 2016. She had a hysterectomy in 2001. She reports having night sweats for the last 6 months. She is a smoker; 1 pack every 3 days. She states that she has not had it looked into, because she was "taking care of her late fiance' who dies from lung cancer."   Past Medical History:  Diagnosis Date  . MRSA (methicillin resistant Staphylococcus aureus)   . Ovarian cyst   . Thyroid disease     Past Surgical History:  Procedure Laterality Date  . ABDOMINAL HYSTERECTOMY      History reviewed. No pertinent family history.  Social History   Tobacco Use  . Smoking status: Current Every Day Smoker    Packs/day: 0.25  . Smokeless tobacco: Never Used  Substance Use Topics  . Alcohol use: No  . Drug use: No    Allergies:  Allergies  Allergen Reactions  . Onion Other (See Comments)    Tongue swelling    Medications Prior to Admission  Medication Sig Dispense Refill Last Dose  . benzonatate (TESSALON) 100 MG capsule Take 1 capsule (100 mg total) by mouth every 8 (eight) hours. 21 capsule 0   . dextromethorphan-guaiFENesin (MUCINEX DM) 30-600 MG per 12 hr tablet Take 1 tablet by mouth every 12  (twelve) hours. For congestion.   Past Week at Unknown  . naproxen (NAPROSYN) 500 MG tablet Take 1 tablet (500 mg total) by mouth 2 (two) times daily. 20 tablet 0   . predniSONE (DELTASONE) 20 MG tablet Take 2 tablets (40 mg total) by mouth daily. 10 tablet 0     Review of Systems  Constitutional: Negative.   HENT: Negative.   Eyes: Negative.   Respiratory: Negative.   Cardiovascular: Negative.   Gastrointestinal: Negative.   Endocrine: Negative.   Genitourinary: Negative.   Musculoskeletal: Negative.   Skin:       Larger, painful lump in RT breast at ~ 10 o'clock area that has been there > 1 year, clear brown discharge from RT nipple, ?dimpling of skin and retraction of RT nipple on one side  Allergic/Immunologic: Negative.   Neurological: Negative.   Hematological: Negative.   Psychiatric/Behavioral: Negative.    Physical Exam   Blood pressure 111/76, pulse 81, temperature 98.4 F (36.9 C), temperature source Oral, resp. rate 18, weight 60.6 kg, SpO2 97 %.  Physical Exam  Nursing note and vitals reviewed. Constitutional: She is oriented to person, place, and time. She appears well-developed and well-nourished.  HENT:  Head: Normocephalic and atraumatic.  Eyes: Pupils are  equal, round, and reactive to light.  Neck: Normal range of motion.  Cardiovascular: Normal rate.  Respiratory: Effort normal. Right breast exhibits tenderness (at 10 o'clock). Mass: Thickening felt underneath skin, no palpable mass with defined edges. Nipple discharge: not observed. Nipple discharge: not observed.    GI: Soft.  Genitourinary:  Genitourinary Comments: deferred  Musculoskeletal: Normal range of motion.  Neurological: She is alert and oriented to person, place, and time.  Skin: Skin is warm and dry.  Psychiatric: She has a normal mood and affect. Her behavior is normal. Judgment and thought content normal.    MAU Course  Procedures  MDM CCUA UPT  *TC to [facility] to get patient  scheduled for diagnostic mammogram - spoke with Dario Guardian in scheduling who advised that since patient doesn't have insurance to get her scheduled with [facility] scholarship program in order to avoid a financial burden for the patient. // Msg sent to Fonnie Mu to get patient enrolled in the [facility] program. Christine's number given to patient as well. Advised to call in the morning to speak to [place] about the program.  Assessment and Plan  Lump of right breast - Plan: Discharge patient - Refer to [facility]CP program for diagnostic mammogram - Msg sent to Fonnie Mu to get patient registered in the [facility]CP program  Mastalgia - Rx for Vicoprofen (sent through orders only function) - Discharge patient - Patient verbalized an understanding of the plan of care and agrees.    Raelyn Mora, MSN, CNM 12/07/2017, 2:01 PM

## 2017-12-07 NOTE — MAU Note (Signed)
Has a lump on her rt breast, first noted about a year ago. Started hurting about a wk ago. Also started having some brown drainage from the rt nipple at that.  ( was taking care of her fiance who had lung cancer, so was put it on the back burner).

## 2017-12-07 NOTE — Progress Notes (Signed)
Patient requests something stronger than ibuprofen for her breast pain. She states, "I've been taking ibuprofen this whole time and it is just not enough for the pain I'm having."  Raelyn Mora, CNM

## 2017-12-09 ENCOUNTER — Telehealth (HOSPITAL_COMMUNITY): Payer: Self-pay | Admitting: *Deleted

## 2017-12-09 NOTE — Telephone Encounter (Signed)
Attempted to call patient to [facility]. No one answered phone and voicemail box full.

## 2017-12-09 NOTE — Telephone Encounter (Signed)
Attempted to call patient again to schedule appointment in [facility]. No one answered phone and voicemail box full.

## 2017-12-11 ENCOUNTER — Inpatient Hospital Stay (HOSPITAL_COMMUNITY)
Admission: AD | Admit: 2017-12-11 | Discharge: 2017-12-11 | Disposition: A | Discharge status: Home / Self Care | Payer: Self-pay | Source: Ambulatory Visit | Attending: Obstetrics & Gynecology | Admitting: Obstetrics & Gynecology

## 2017-12-11 ENCOUNTER — Telehealth (HOSPITAL_COMMUNITY): Payer: Self-pay | Admitting: *Deleted

## 2017-12-11 DIAGNOSIS — N644 Mastodynia: Secondary | ICD-10-CM

## 2017-12-11 MED ORDER — HYDROCODONE-IBUPROFEN 5-200 MG PO TABS: 1.0000 | ORAL_TABLET | Freq: Three times a day (TID) | ORAL | 0 refills | Status: AC | PRN

## 2017-12-11 MED ORDER — HYDROCODONE-IBUPROFEN 5-200 MG PO TABS: 1.0000 | ORAL_TABLET | Freq: Three times a day (TID) | ORAL | 0 refills | Status: DC | PRN

## 2017-12-11 NOTE — Telephone Encounter (Signed)
Patient returned to the MAU due to increased right breast pain. Received call from MAU to refer to [facility]CP. Spoke with patient and verified eligibility for [facility]. Scheduled patient appointment with [facility] for next Tuesday, December 15, 2017 at 1315. Explained [facility] to patient and where she will need to go for appointment. Patient verbalized understanding.

## 2017-12-11 NOTE — MAU Provider Note (Signed)
Patient Susan FurnishJulie G Christy is a 42 y.o. non-pregnant here with complaints of nipple pain and discharge. She was seen in MAU on 11/12; she was supposed to have follow up with [facility] but, per chart review, [facility] has not been able to get in touch with her. Patient reports that she has cell phone trouble.  She feels like her breast pain is getting worse and came back to be seen. The pain is much worse at night and ibuprofen is not working. She says she was [facility] able [facility] [place]; she would like her prescription refilled.    History     CSN: [id number]  Arrival date and time: 12/11/17 1624   None     No chief complaint on file.  HPI  OB History    Gravida  2   Para  2   Term  2   Preterm      AB      Living  2     SAB      TAB      Ectopic      Multiple      Live Births              Past Medical History:  Diagnosis Date  . MRSA (methicillin resistant Staphylococcus aureus)   . Ovarian cyst   . Thyroid disease     Past Surgical History:  Procedure Laterality Date  . ABDOMINAL HYSTERECTOMY      No family history on file.  Social History   Tobacco Use  . Smoking status: Current Every Day Smoker    Packs/day: 0.25  . Smokeless tobacco: Never Used  Substance Use Topics  . Alcohol use: No  . Drug use: No    Allergies:  Allergies  Allergen Reactions  . Onion Other (See Comments)    Tongue swelling    Medications Prior to Admission  Medication Sig Dispense Refill Last Dose  . benzonatate (TESSALON) 100 MG capsule Take 1 capsule (100 mg total) by mouth every 8 (eight) hours. 21 capsule 0   . dextromethorphan-guaiFENesin (MUCINEX DM) 30-600 MG per 12 hr tablet Take 1 tablet by mouth every 12 (twelve) hours. For congestion.   Past Week at Unknown  . hydrocodone-ibuprofen (VICOPROFEN) 5-200 MG tablet Take 1 tablet by mouth every 8 (eight) hours as needed for pain. Do not take at the same time as regular ibuprofen  15 tablet 0   . naproxen (NAPROSYN) 500 MG tablet Take 1 tablet (500 mg total) by mouth 2 (two) times daily. 20 tablet 0   . predniSONE (DELTASONE) 20 MG tablet Take 2 tablets (40 mg total) by mouth daily. 10 tablet 0     Review of Systems  Constitutional: Negative.   HENT: Negative.   Respiratory: Negative.   Gastrointestinal: Negative.   Genitourinary: Negative.   Musculoskeletal: Negative.   Hematological: Negative.    Physical Exam   There were no vitals taken for this visit.  Physical Exam  Constitutional: She appears well-developed.  HENT:  Head: Normocephalic.  Neck: Normal range of motion.  Respiratory: Effort normal.  GI: Soft.  Musculoskeletal: Normal range of motion.  Neurological: She is alert.  Skin: Skin is warm.  Breast exam deferred.   MAU Course  Procedures  MDM -called [facility] in [place] who confirmed that patient did not have any medicines waiting for her.   Assessment and Plan   1. Mastalgia    -Spoke with  Cristina Gong at [facility]; she was able to schedule patient for follow up at 1:15 on Tuesday, Nov 19.  Patient spoke to [facility] and confirmed appt time and location.   -Spoke with [place] about patient's complaint of occasional vaginal bleeding; Dr. Erin Fulling will send message to [facility] for patient to be seen for vaginal bleeding at [facility].   -Patient given RX for pain medicine; reviewed warning signs and when to return to MAU.   Charlesetta Garibaldi Kooistra 12/11/2017, 5:11 PM

## 2017-12-11 NOTE — Discharge Instructions (Signed)
Mammogram A mammogram is an X-ray of the breasts that is done to check for changes that are not normal. This test can screen for and find any changes that may suggest breast cancer. This test can also help to find other changes and variations in the breast. What happens before the procedure?  Have this test done about 1-2 weeks after your period. This is usually when your breasts are the least tender.  If you are visiting a new doctor or clinic, send any past mammogram images to your new doctor's office.  Wash your breasts and under your arms the day of the test.  Do not use deodorants, perfumes, lotions, or powders on the day of the test.  Take off any jewelry from your neck.  Wear clothes that you can change into and out of easily. What happens during the procedure?  You will undress from the waist up. You will put on a gown.  You will stand in front of the X-ray machine.  Each breast will be placed between two plastic or glass plates. The plates will press down on your breast for a few seconds. Try to stay as relaxed as possible. This does not cause any harm to your breasts. Any discomfort you feel will be very brief.  X-rays will be taken from different angles of each breast. The procedure may vary among doctors and hospitals. What happens after the procedure?  The mammogram will be looked at by a specialist (radiologist).  You may need to do certain parts of the test again. This depends on the quality of the images.  Ask when your test results will be ready. Make sure you get your test results.  You may go back to your normal activities. This information is not intended to replace advice given to you by your health care provider. Make sure you discuss any questions you have with your health care provider. Document Released: 04/11/2008 Document Revised: 06/21/2015 Document Reviewed: 03/24/2014 Elsevier Interactive Patient Education  2018 Elsevier Inc.  

## 2017-12-14 ENCOUNTER — Other Ambulatory Visit (HOSPITAL_COMMUNITY): Payer: Self-pay | Admitting: *Deleted

## 2017-12-14 DIAGNOSIS — N6310 Unspecified lump in the right breast, unspecified quadrant: Secondary | ICD-10-CM

## 2017-12-15 ENCOUNTER — Ambulatory Visit: Payer: Self-pay

## 2017-12-15 ENCOUNTER — Ambulatory Visit (HOSPITAL_COMMUNITY)
Admit: 2017-12-15 | Discharge: 2017-12-15 | Disposition: A | Discharge status: Home / Self Care | Payer: Self-pay | Source: Ambulatory Visit | Attending: Obstetrics and Gynecology | Admitting: Obstetrics and Gynecology

## 2017-12-15 ENCOUNTER — Encounter (HOSPITAL_COMMUNITY): Payer: Self-pay

## 2017-12-15 VITALS — BP 112/68 | Wt 137.0 lb

## 2017-12-15 DIAGNOSIS — N6325 Unspecified lump in the left breast, overlapping quadrants: Secondary | ICD-10-CM

## 2017-12-15 DIAGNOSIS — N6310 Unspecified lump in the right breast, unspecified quadrant: Secondary | ICD-10-CM

## 2017-12-15 DIAGNOSIS — N6320 Unspecified lump in the left breast, unspecified quadrant: Secondary | ICD-10-CM

## 2017-12-15 DIAGNOSIS — Z1239 Encounter for other screening for malignant neoplasm of breast: Secondary | ICD-10-CM

## 2017-12-15 DIAGNOSIS — N6452 Nipple discharge: Secondary | ICD-10-CM | POA: Insufficient documentation

## 2017-12-15 NOTE — Progress Notes (Signed)
Complaints of a right breast lump x one year. Complaints of new right nipple inversion, brown breast discharge, and pain that started 1.5 months ago. Patient states the pain comes and goes. Patient rates the pain at a 7 out of 10.  Pap Smear: Pap smear not completed today. Last Pap smear was in 2001 and normal per patient. Per patient has no history of an abnormal Pap smear. Patient has a history of a hysterectomy in 2001 due to AUB. Patient no longer needs Pap smears due to her history of a hysterectomy for benign reasons per BCCCP and ACOG guidelines. No Pap smear results are in Epic.  Physical exam: Breasts Breasts symmetrical. No skin abnormalities bilateral breasts. No nipple retraction bleft breast. Right nipple slightly inverted compared to left nipple that per patient is a change for her. No nipple discharge left breast. Expressed a clear to green colored discharge from the right breast on exam. Sample of breast discharge sent to Cytology for evaluation. No lymphadenopathy. Palpated a pea sized mobile lump within the left breast at 12 o'clock 2 cm from the nipple. Palpated a lump within the right breast between 10-12 o'clock above the areola. No complaints of pain or tenderness on exam. Complaints of tenderness when palpated right breast lump. [facility] for a diagnostic mamm[facility] and bilateral breast ultrasounds. Appointment scheduled for Tuesday, December 15, 2017 at 1450.        Pelvic/Bimanual No Pap smear completed today since patient has a history of a hysterectomy for benign reasons. Pap smear not indicated per BCCCP guidelines.   Smoking History: Patient is a current smoker. Discussed smoking cessation with patient. Referred to the [facility] and gave resources to free smoking cessation classes at [facility].  Patient Navigation: Patient education provided. Access to services provided for patient through BCCCP program.   Breast and Cervical  Cancer Risk Assessment: Patient has a family history of her maternal grandmother, paternal grandmother, a maternal aunt, and a paternal aunt having breast cancer. Patient has no known genetic mutations or history of radiation treatment to the chest before age 42. Patient has no history of cervical dysplasia, immunocompromised, or DES exposure in-utero.  Risk Assessment    Risk Scores      12/15/2017   Last edited by: Lynnell DikeHolland, Sabrina H, LPN   5-year risk: 0.5 %   Lifetime risk: 7.1 %

## 2017-12-15 NOTE — Patient Instructions (Signed)
Explained breast self awareness with Susan FurnishJulie G Noble. Patient did not need a Pap smear today due to patient has a history of a hysterectomy for benign reasons. Let patient know that she doesn't need any further Pap smears due to her history of a hysterectomy for benign reasons. [facility] for a diagnostic mammogram and bilateral breast ultrasounds. Appointment scheduled for Tuesday, December 15, 2017 at 1450. Patient aware of appointment and will be there. Let patient know will follow up with her within the next couple weeks with results of breast discharge by phone. Discussed smoking cessation with patient. Referred to the [facility] and gave resources to free smoking cessation classes at [facility][name]. [name] verbalized understanding.  Feliz Herard, Kathaleen Maserhristine Poll, RN 1:53 PM

## 2017-12-16 ENCOUNTER — Telehealth (HOSPITAL_COMMUNITY): Payer: Self-pay | Admitting: *Deleted

## 2017-12-16 NOTE — Telephone Encounter (Signed)
Patient called me an left voicemail in regards to missing her appointment at the Breast Center for her diagnostic mammogram and breast ultrasound. Attempted to call patient back. No one answered phone. Left voicemail for patient to call me back. 

## 2017-12-17 ENCOUNTER — Telehealth (HOSPITAL_COMMUNITY): Payer: Self-pay | Admitting: *Deleted

## 2017-12-17 ENCOUNTER — Encounter: Payer: Self-pay | Admitting: Family Medicine

## 2017-12-17 NOTE — Telephone Encounter (Signed)
Patient returned my phone call. Explained to patient that she can call the Breast Center directly to reschedule her diagnostic mammogram appointment. Patient stated she did but was told to call me. Told patient will contact the Breast Center to reschedule her appointment and call her back. Explained to patient that her breast discharge was benign. Patient verbalized understanding.  Attempted to contact the Breast Center scheduling department to reschedule appointment. Sent message to the Breast Center to reschedule patients diagnostic mammogram appointment and asked them to let me know.

## 2017-12-17 NOTE — Telephone Encounter (Signed)
Patient called me an left voicemail in regards to missing [facility] and breast ultrasound. Attempted to call patient back. No one answered phone. Left voicemail for patient to call me back.

## 2017-12-17 NOTE — Progress Notes (Signed)
Patient did not keep appointment today. She may call to reschedule.  

## 2017-12-21 ENCOUNTER — Telehealth (HOSPITAL_COMMUNITY): Payer: Self-pay | Admitting: *Deleted

## 2017-12-21 NOTE — Telephone Encounter (Signed)
Patient returned my phone call and gave [facility]. Reminded patient to take her pink BCCCP card to the appointment. Patient verbalized understanding.

## 2017-12-21 NOTE — Telephone Encounter (Signed)
Attempted to call patient to give [facility]. No one answered the phone and patients voicemail box is full. Unable to leave a message.

## 2017-12-23 ENCOUNTER — Other Ambulatory Visit: Payer: Self-pay

## 2017-12-28 ENCOUNTER — Encounter (HOSPITAL_COMMUNITY): Payer: Self-pay | Admitting: *Deleted

## 2018-01-08 ENCOUNTER — Telehealth (HOSPITAL_COMMUNITY): Payer: Self-pay | Admitting: *Deleted

## 2018-01-08 NOTE — Telephone Encounter (Signed)
Telephoned patient at home number and advised patient she would need to call the Breast Center to reschedule mammogram appointment. Patient voiced understanding.

## 2018-01-14 ENCOUNTER — Ambulatory Visit
Admission: RE | Admit: 2018-01-14 | Discharge: 2018-01-14 | Disposition: A | Discharge status: Home / Self Care | Payer: No Typology Code available for payment source | Source: Ambulatory Visit | Attending: Obstetrics and Gynecology | Admitting: Obstetrics and Gynecology

## 2018-01-14 ENCOUNTER — Other Ambulatory Visit (HOSPITAL_COMMUNITY): Payer: Self-pay | Admitting: Obstetrics and Gynecology

## 2018-01-14 DIAGNOSIS — N6310 Unspecified lump in the right breast, unspecified quadrant: Secondary | ICD-10-CM

## 2018-10-02 ENCOUNTER — Encounter: Payer: Self-pay | Admitting: Emergency Medicine

## 2018-10-02 ENCOUNTER — Other Ambulatory Visit: Payer: Self-pay

## 2018-10-02 ENCOUNTER — Emergency Department
Admission: EM | Admit: 2018-10-02 | Discharge: 2018-10-02 | Disposition: A | Discharge status: Home / Self Care | Payer: Self-pay | Source: Home / Self Care

## 2018-10-02 DIAGNOSIS — R1010 Upper abdominal pain, unspecified: Secondary | ICD-10-CM

## 2018-10-02 DIAGNOSIS — R109 Unspecified abdominal pain: Secondary | ICD-10-CM

## 2018-10-02 LAB — POCT CBC W AUTO DIFF (K'VILLE URGENT CARE)

## 2018-10-02 LAB — POCT URINALYSIS DIP (MANUAL ENTRY)
Blood, UA: NEGATIVE
Glucose, UA: NEGATIVE mg/dL
Ketones, POC UA: NEGATIVE mg/dL
Leukocytes, UA: NEGATIVE
Nitrite, UA: NEGATIVE
Protein Ur, POC: NEGATIVE mg/dL
Spec Grav, UA: >=1.03 — AB (ref 1.010–1.025)
Urobilinogen, UA: 0.2 E.U./dL
pH, UA: 5 (ref 5.0–8.0)

## 2018-10-02 MED ORDER — DICYCLOMINE HCL 20 MG PO TABS: 20.0000 mg | ORAL_TABLET | Freq: Two times a day (BID) | ORAL | 0 refills | Status: AC

## 2018-10-02 NOTE — ED Triage Notes (Signed)
Patient c/o stomach cramps x 1 week, some swelling, no nausea, no vomiting, no diarrhea, no u/a sx's.  Patient has taken Ibuprofen without relief.

## 2018-10-02 NOTE — ED Provider Notes (Signed)
[facility]    CSN: [id number] Arrival date & time: 10/02/18  1511      History   Chief Complaint Chief Complaint  Patient presents with  . Abdominal Pain    HPI [name] is a 43 y.o. female.   HPI  [name] is a 43 y.o. female presenting to UC with c/o 1 week of centralized abdominal cramps that feel similar to menstrual cramps but she reports having a hysterectomy.  Pain is mild to moderate, nothing seems to make it better or worse. Denies fever, chills, n/v/d. Denies being constipated.  No sick contacts or recent travel. She is able to eat and drink normally.  No prior hx of gallbladder issues.    Past Medical History:  Diagnosis Date  . MRSA (methicillin resistant Staphylococcus aureus)   . Ovarian cyst   . Thyroid disease     Patient Active Problem List   Diagnosis Date Noted  . Lump of right breast 12/07/2017  . Mastalgia 12/07/2017    Past Surgical History:  Procedure Laterality Date  . ABDOMINAL HYSTERECTOMY      OB History    Gravida  2   Para  2   Term  2   Preterm      AB      Living  2     SAB      TAB      Ectopic      Multiple      Live Births               Home Medications    Prior to Admission medications   Medication Sig Start Date End Date Taking? Authorizing Provider  benzonatate (TESSALON) 100 MG capsule Take 1 capsule (100 mg total) by mouth every 8 (eight) hours. Patient not taking: Reported on 12/15/2017 02/28/15   Eber HongMiller, Brian, MD  dextromethorphan-guaiFENesin [facility](MUCINEX DM) 30-600 MG per 12 hr tablet Take 1 tablet by mouth every 12 (twelve) hours. For congestion.    [provider]  dicyclomine (BENTYL) 20 MG tablet Take 1 tablet (20 mg total) by mouth 2 (two) times daily. 10/02/18   Lurene ShadowPhelps, Fountain Derusha O, PA-C  hydrocodone-ibuprofen (VICOPROFEN) 5-200 MG tablet Take 1 tablet by mouth every 8 (eight) hours as needed for pain. Do not take at the same time as regular ibuprofen Patient not  taking: Reported on 12/15/2017 12/11/17   Marylene LandKooistra, Kathryn Lorraine, CNM  naproxen (NAPROSYN) 500 MG tablet Take 1 tablet (500 mg total) by mouth 2 (two) times daily. Patient not taking: Reported on 12/15/2017 02/18/15   Janne NapoleonNeese, Hope M, NP  predniSONE (DELTASONE) 20 MG tablet Take 2 tablets (40 mg total) by mouth daily. Patient not taking: Reported on 12/15/2017 02/28/15   Eber HongMiller, Brian, MD    Family History Family History  Problem Relation Age of Onset  . Breast cancer Maternal Aunt   . Breast cancer Paternal Aunt   . Breast cancer Maternal Grandmother   . Breast cancer Paternal Grandmother     Social History Social History   Tobacco Use  . Smoking status: Current Every Day Smoker    Packs/day: 0.25  . Smokeless tobacco: Never Used  Substance Use Topics  . Alcohol use: No  . Drug use: No     Allergies   Onion   Review of Systems Review of Systems  Constitutional: Negative for chills and fever.  Cardiovascular: Negative for chest pain, palpitations and leg swelling.  Gastrointestinal: Positive for abdominal pain.  Negative for blood in stool, constipation, diarrhea, nausea and vomiting.  Genitourinary: Negative for dysuria, flank pain, frequency and hematuria.  Musculoskeletal: Negative for back pain.  Skin: Negative for rash.     Physical Exam Triage Vital Signs ED Triage Vitals  Enc Vitals Group     BP 10/02/18 1533 135/84     Pulse Rate 10/02/18 1533 76     Resp --      Temp 10/02/18 1533 (!) 97.5 F (36.4 C)     Temp Source 10/02/18 1533 Oral     SpO2 10/02/18 1533 100 %     Weight 10/02/18 1534 137 lb (62.1 kg)     Height 10/02/18 1534 5\' 6"  (1.676 m)     Head Circumference --      Peak Flow --      Pain Score 10/02/18 1534 5     Pain Loc --      Pain Edu? --      Excl. in GC? --    No data found.  Updated Vital Signs BP 135/84 (BP Location: Right Arm)   Pulse 76   Temp (!) 97.5 F (36.4 C) (Oral)   Ht 5\' 6"  (1.676 m)   Wt 137 lb (62.1 kg)    SpO2 100%   BMI 22.11 kg/m   Visual Acuity Right Eye Distance:   Left Eye Distance:   Bilateral Distance:    Right Eye Near:   Left Eye Near:    Bilateral Near:     Physical Exam Vitals signs and nursing note reviewed.  Constitutional:      Appearance: She is well-developed.  HENT:     Head: Normocephalic and atraumatic.  Neck:     Musculoskeletal: Normal range of motion.  Cardiovascular:     Rate and Rhythm: Normal rate and regular rhythm.  Pulmonary:     Effort: Pulmonary effort is normal. No respiratory distress.     Breath sounds: Normal breath sounds.  Abdominal:     General: There is no distension.     Palpations: Abdomen is soft.     Tenderness: There is abdominal tenderness in the right upper quadrant, epigastric area, periumbilical area and left upper quadrant. There is no right CVA tenderness, left CVA tenderness, guarding or rebound.  Musculoskeletal: Normal range of motion.  Skin:    General: Skin is warm and dry.  Neurological:     Mental Status: She is alert and oriented to person, place, and time.  Psychiatric:        Behavior: Behavior normal.      UC Treatments / Results  Labs (all labs ordered are listed, but only abnormal results are displayed) Labs Reviewed  POCT URINALYSIS DIP (MANUAL ENTRY) - Abnormal; Notable for the following components:      Result Value   Bilirubin, UA small (*)    Spec Grav, UA >=1.030 (*)    All other components within normal limits  COMPLETE METABOLIC PANEL WITH GFR  LIPASE  POCT CBC W AUTO DIFF ([facility])    EKG   Radiology No results found.  Procedures Procedures (including critical care time)  Medications Ordered in UC Medications - No data to display  Initial Impression / Assessment and Plan / UC Course  I have reviewed the triage vital signs and the nursing notes.  Pertinent labs & imaging results that were available during my care of the patient were reviewed by me and considered in my  medical decision making (see  chart for details).     Upper and central abdominal tenderness w/o rebound, guarding or masses palpated. No CVAT tenderness. CBC: WNL UA: unremarakble CMP and Lipase pending. No evidence of acute abdomen at this time. Will have pt try trial of bentyl for cramping and bland diet. Discussed symptoms that warrant emergent care in the ED. AVS provided with resource guide for PCP and GI as pt is new to the area.  Final Clinical Impressions(s) / UC Diagnoses   Final diagnoses:  Central abdominal pain  Upper abdominal pain     Discharge Instructions      You may try the medication prescribed to help with abdominal cramping. Be sure to stay well hydrated and stick with a bland diet (no spicy, fried or fatty food, no dairy) to help limit stomach upset for a few days.  Call 911 or go to the hospital if symptoms significantly worsening- worsening pain, vomiting, fever, blood in stool, or other new concerning symptoms develop.     ED Prescriptions    Medication Sig Dispense Auth. Provider   dicyclomine (BENTYL) 20 MG tablet Take 1 tablet (20 mg total) by mouth 2 (two) times daily. 10 tablet Noe Gens, PA-C     Controlled Substance Prescriptions [place] Controlled Substance Registry consulted? Not Applicable   Tyrell Antonio 10/02/18 1610

## 2018-10-02 NOTE — Discharge Instructions (Signed)
°  You may try the medication prescribed to help with abdominal cramping. Be sure to stay well hydrated and stick with a bland diet (no spicy, fried or fatty food, no dairy) to help limit stomach upset for a few days.  Call 911 or go to the hospital if symptoms significantly worsening- worsening pain, vomiting, fever, blood in stool, or other new concerning symptoms develop.

## 2018-10-03 LAB — COMPLETE METABOLIC PANEL WITH GFR
AG Ratio: 1.9 (calc) (ref 1.0–2.5)
ALT: 9 U/L (ref 6–29)
AST: 11 U/L (ref 10–30)
Albumin: 3.9 g/dL (ref 3.6–5.1)
Alkaline phosphatase (APISO): 46 U/L (ref 31–125)
BUN: 9 mg/dL (ref 7–25)
CO2: 28 mmol/L (ref 20–32)
Calcium: 8.9 mg/dL (ref 8.6–10.2)
Chloride: 108 mmol/L (ref 98–110)
Creat: 0.92 mg/dL (ref 0.50–1.10)
GFR, Est African American: 88 mL/min/{1.73_m2} (ref >=60)
GFR, Est Non African American: 76 mL/min/{1.73_m2} (ref >=60)
Globulin: 2.1 g/dL (calc) (ref 1.9–3.7)
Glucose, Bld: 98 mg/dL (ref 65–99)
Potassium: 4.1 mmol/L (ref 3.5–5.3)
Sodium: 141 mmol/L (ref 135–146)
Total Bilirubin: 0.3 mg/dL (ref 0.2–1.2)
Total Protein: 6 g/dL — ABNORMAL LOW (ref 6.1–8.1)

## 2018-10-03 LAB — LIPASE: Lipase: 16 U/L (ref 7–60)

## 2018-10-04 ENCOUNTER — Telehealth (HOSPITAL_COMMUNITY): Payer: Self-pay | Admitting: Emergency Medicine

## 2018-10-04 NOTE — Telephone Encounter (Signed)
Normal labs, Patient contacted and made aware of    results, all questions answered Pt will follow up with pcp dur to continued symptoms.

## 2019-03-16 IMAGING — MG DIGITAL DIAGNOSTIC BILATERAL MAMMOGRAM WITH TOMO AND CAD
4 of 9 series · 4 of 33 positions shown · non-contrast
Comparison: Previous exam(s).

CLINICAL DATA: Patient presents with tender lump along the lateral
aspect of the right breast. She also complains of bilateral greenish
nipple discharge, for your on the right approximately 1 month the
left. Patient's referring clinician reports a lump in the upper
outer left breast.

EXAM:
DIGITAL DIAGNOSTIC BILATERAL MAMMOGRAM WITH CAD AND TOMO
ULTRASOUND BILATERAL BREAST

[L CC synth-2D]
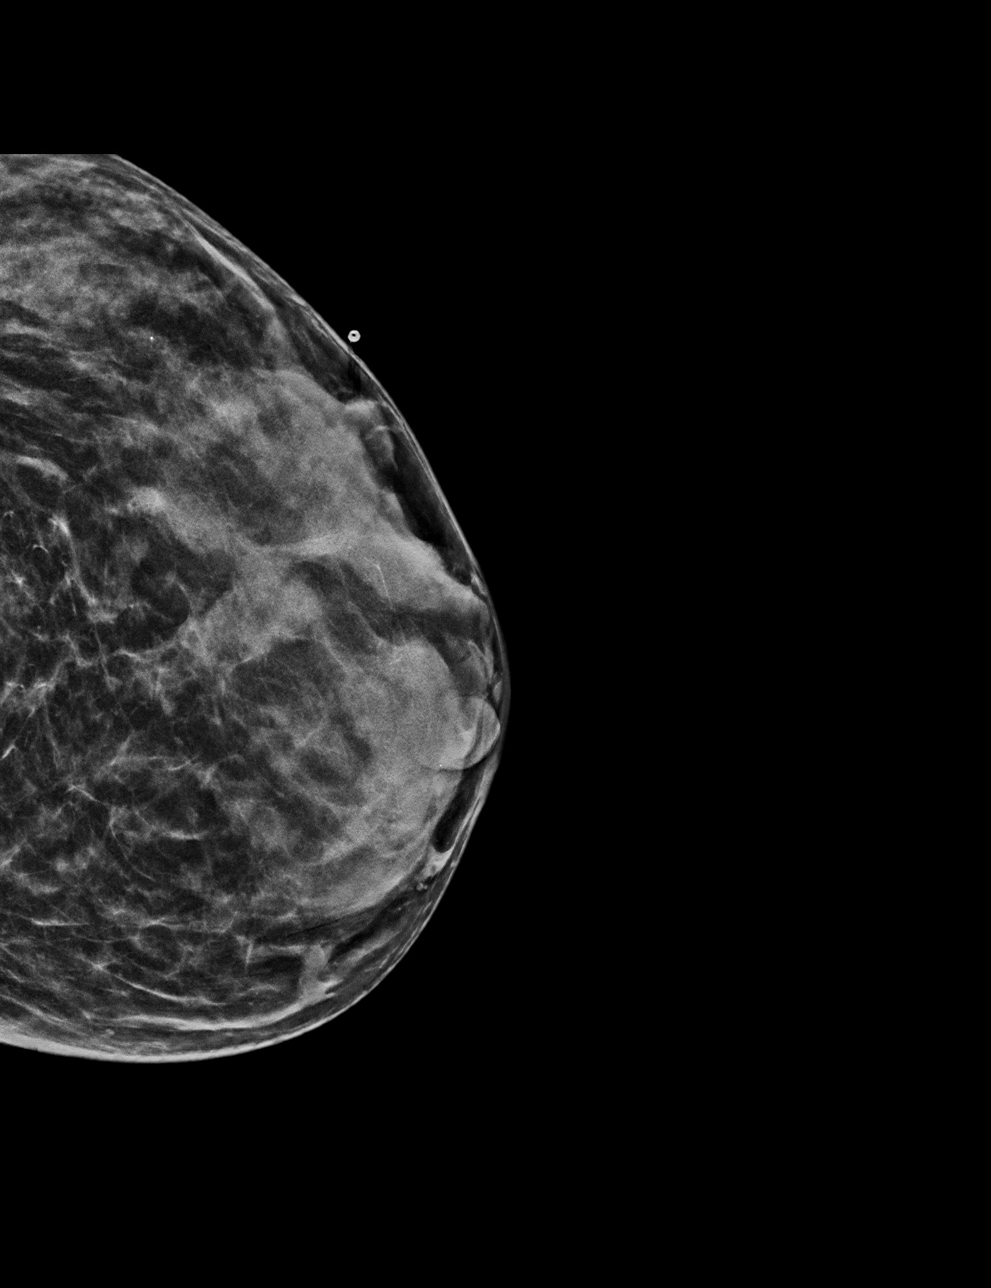

[R MLO synth-2D]
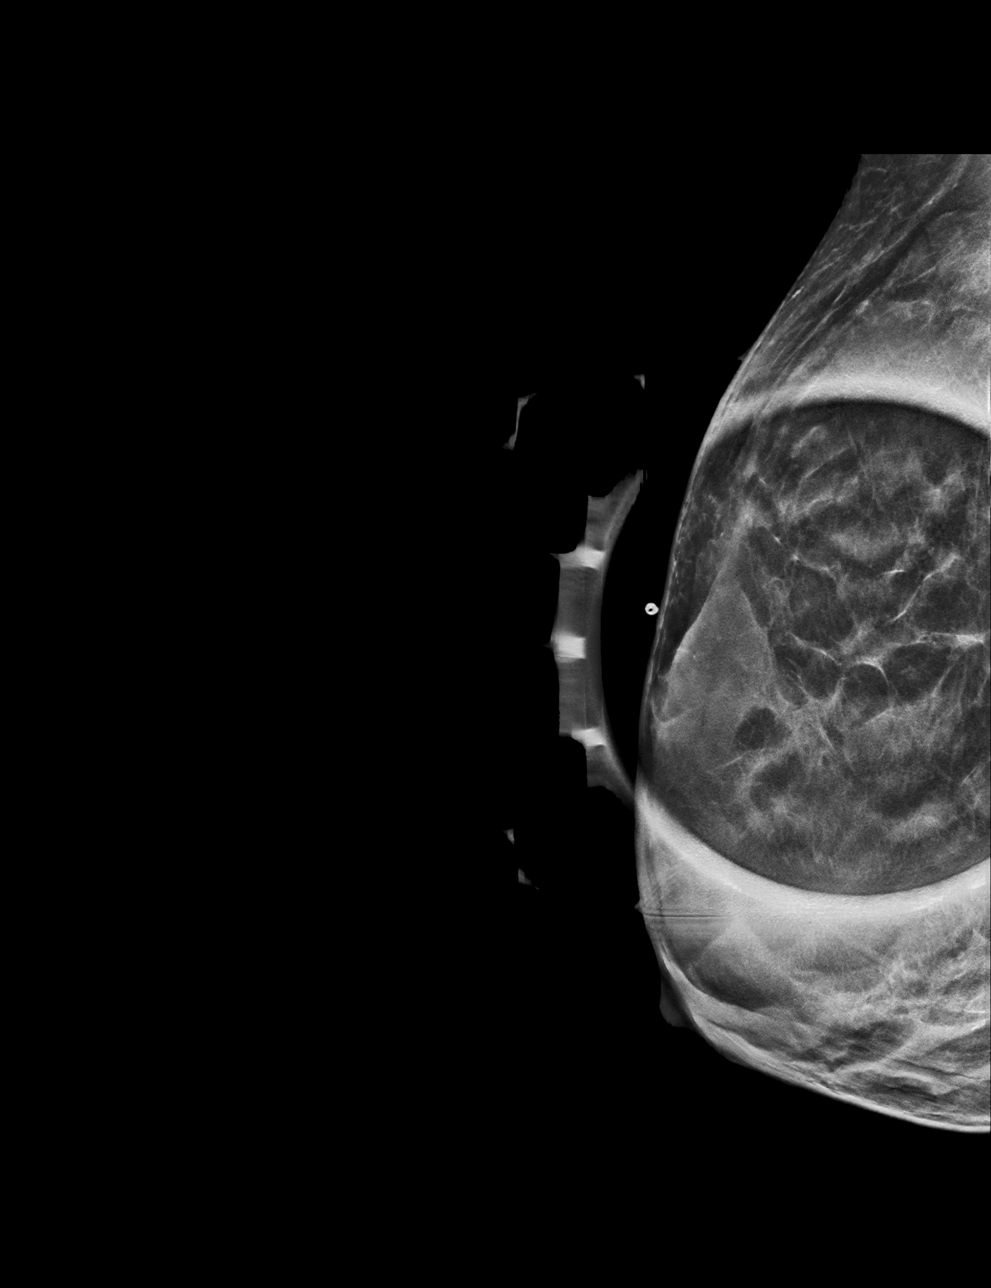

[L MLO synth-2D]
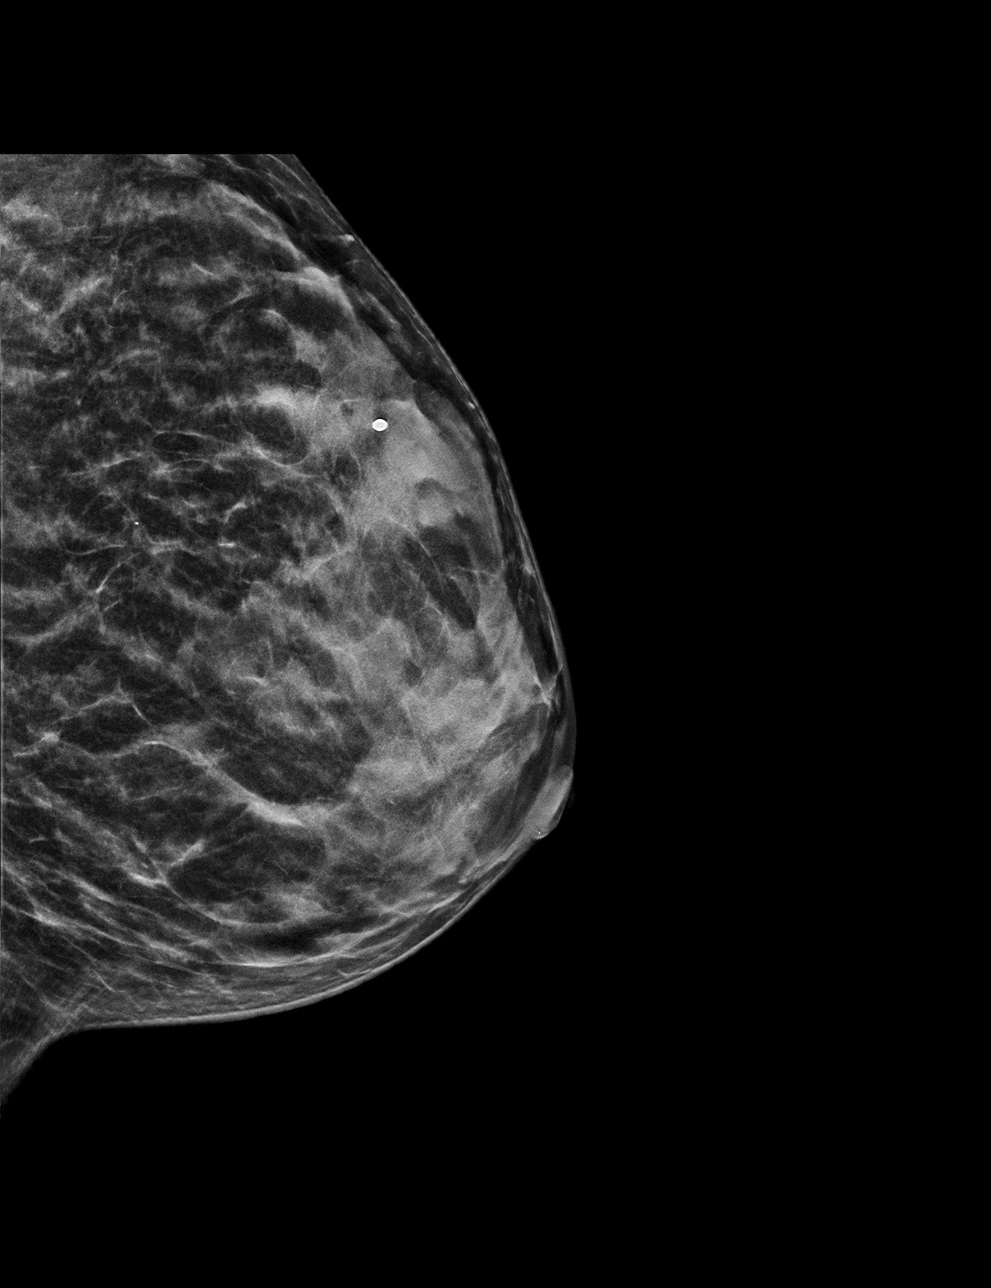

[L CC tomo · tomo slice 23/45.0]
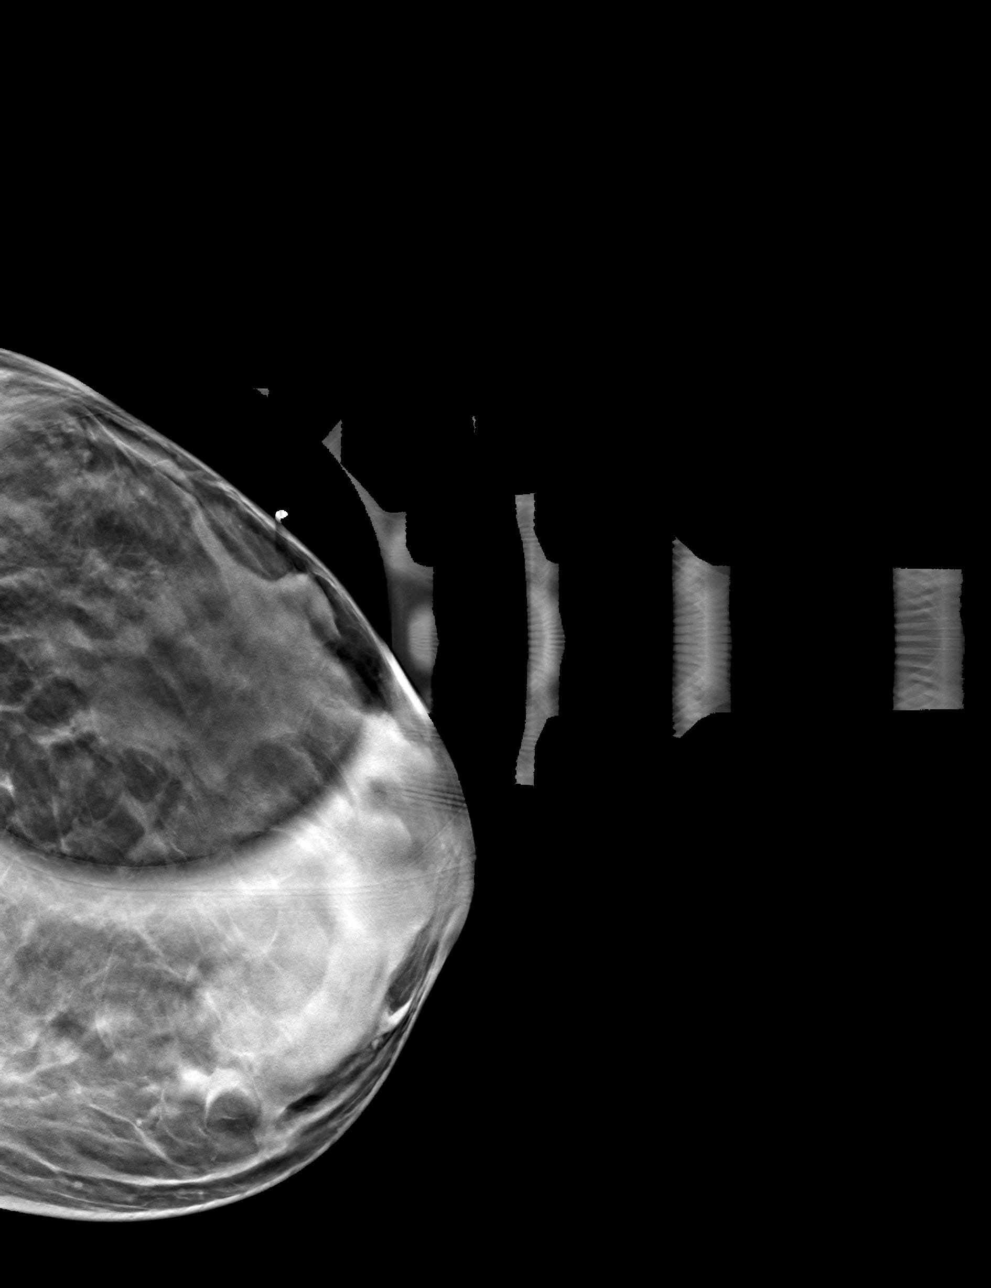

[4 of 33 positions shown; findings below may reference images not displayed]

ACR Breast Density Category c: The breast tissue is heterogeneously
dense, which may obscure small masses.
FINDINGS: There are no discrete masses, areas of architectural distortion,
areas of significant asymmetry or suspicious calcifications. There
is been interval weight loss since the prior bilateral mammography
dated 03/28/2015.

Mammographic images were processed with CAD.

On physical exam, there is generalized nodular texture to the
fibroglandular tissue of both breasts, with a more discrete nodular
area in the lateral right breast.

Targeted right breast ultrasound is performed, showing heterogeneous
fibroglandular tissue with numerous small cysts and clusters of
cysts, but no solid masses or suspicious lesions. Sonography was
targeted to the lateral right breast. In the retroareolar region,
there are no masses or dilated ducts or intraductal masses.

Targeted left breast ultrasound is performed, showing heterogeneous
fibroglandular tissue. There discrete masses or suspicious lesions.
Evaluation of the retroareolar region shows no masses, dilated ducts
or intraductal masses.
IMPRESSION: 1. No evidence of breast malignancy.
2. Multiple small benign cysts on the right.

RECOMMENDATION:
Screening mammogram in one year.(Code:GC-S-L6Q)

I have discussed the findings and recommendations with the patient.
Results were also provided in writing at the conclusion of the
visit. If applicable, a reminder letter will be sent to the patient
regarding the next appointment.

BI-RADS CATEGORY  2: Benign.
